# Patient Record
Sex: Female | Born: 1940 | Race: White | Hispanic: No | Smoking: Current every day smoker
Health system: Southern US, Community
[De-identification: ages and names within clinical notes are randomized; demographics above are authoritative.]

## PROBLEM LIST (undated history)

## (undated) DIAGNOSIS — F0390 Unspecified dementia without behavioral disturbance: Secondary | ICD-10-CM

## (undated) DIAGNOSIS — E538 Deficiency of other specified B group vitamins: Secondary | ICD-10-CM

## (undated) DIAGNOSIS — G4700 Insomnia, unspecified: Secondary | ICD-10-CM

## (undated) DIAGNOSIS — I4819 Other persistent atrial fibrillation: Secondary | ICD-10-CM

## (undated) DIAGNOSIS — R55 Syncope and collapse: Secondary | ICD-10-CM

## (undated) DIAGNOSIS — F39 Unspecified mood [affective] disorder: Secondary | ICD-10-CM

## (undated) DIAGNOSIS — I951 Orthostatic hypotension: Secondary | ICD-10-CM

## (undated) DIAGNOSIS — E119 Type 2 diabetes mellitus without complications: Secondary | ICD-10-CM

## (undated) DIAGNOSIS — K219 Gastro-esophageal reflux disease without esophagitis: Secondary | ICD-10-CM

## (undated) DIAGNOSIS — E785 Hyperlipidemia, unspecified: Secondary | ICD-10-CM

## (undated) DIAGNOSIS — I10 Essential (primary) hypertension: Secondary | ICD-10-CM

## (undated) HISTORY — DX: Hyperlipidemia, unspecified: E78.5

## (undated) HISTORY — DX: Deficiency of other specified B group vitamins: E53.8

## (undated) HISTORY — DX: Insomnia, unspecified: G47.00

## (undated) HISTORY — DX: Unspecified mood (affective) disorder: F39

## (undated) HISTORY — DX: Gastro-esophageal reflux disease without esophagitis: K21.9

---

## 2017-09-29 ENCOUNTER — Emergency Department
Admission: EM | Admit: 2017-09-29 | Discharge: 2017-09-29 | Disposition: A | Discharge status: Home / Self Care | Payer: Medicare Other | Attending: Emergency Medicine | Admitting: Emergency Medicine

## 2017-09-29 ENCOUNTER — Encounter: Payer: Self-pay | Admitting: Emergency Medicine

## 2017-09-29 ENCOUNTER — Other Ambulatory Visit: Payer: Self-pay

## 2017-09-29 DIAGNOSIS — F17200 Nicotine dependence, unspecified, uncomplicated: Secondary | ICD-10-CM | POA: Diagnosis not present

## 2017-09-29 DIAGNOSIS — R42 Dizziness and giddiness: Secondary | ICD-10-CM | POA: Diagnosis not present

## 2017-09-29 DIAGNOSIS — I10 Essential (primary) hypertension: Secondary | ICD-10-CM | POA: Insufficient documentation

## 2017-09-29 DIAGNOSIS — E119 Type 2 diabetes mellitus without complications: Secondary | ICD-10-CM | POA: Insufficient documentation

## 2017-09-29 HISTORY — DX: Essential (primary) hypertension: I10

## 2017-09-29 HISTORY — DX: Type 2 diabetes mellitus without complications: E11.9

## 2017-09-29 LAB — CBC WITH DIFFERENTIAL/PLATELET
Basophils Absolute: 0 10*3/uL (ref 0–0.1)
Basophils Relative: 0 %
EOS ABS: 0.1 10*3/uL (ref 0–0.7)
EOS PCT: 1 %
HCT: 38.3 % (ref 35.0–47.0)
Hemoglobin: 13.3 g/dL (ref 12.0–16.0)
LYMPHS ABS: 1.6 10*3/uL (ref 1.0–3.6)
LYMPHS PCT: 27 %
MCH: 32.3 pg (ref 26.0–34.0)
MCHC: 34.8 g/dL (ref 32.0–36.0)
MCV: 92.9 fL (ref 80.0–100.0)
MONO ABS: 0.4 10*3/uL (ref 0.2–0.9)
MONOS PCT: 6 %
Neutro Abs: 4.1 10*3/uL (ref 1.4–6.5)
Neutrophils Relative %: 66 %
PLATELETS: 111 10*3/uL — AB (ref 150–440)
RBC: 4.12 MIL/uL (ref 3.80–5.20)
RDW: 14 % (ref 11.5–14.5)
WBC: 6.2 10*3/uL (ref 3.6–11.0)

## 2017-09-29 LAB — BASIC METABOLIC PANEL
Anion gap: 10 (ref 5–15)
BUN: 25 mg/dL — AB (ref 6–20)
CO2: 26 mmol/L (ref 22–32)
CREATININE: 1 mg/dL (ref 0.44–1.00)
Calcium: 9.1 mg/dL (ref 8.9–10.3)
Chloride: 105 mmol/L (ref 101–111)
GFR calc Af Amer: >60 mL/min (ref >60)
GFR, EST NON AFRICAN AMERICAN: 53 mL/min — AB (ref >60)
GLUCOSE: 187 mg/dL — AB (ref 65–99)
POTASSIUM: 4.3 mmol/L (ref 3.5–5.1)
SODIUM: 141 mmol/L (ref 135–145)

## 2017-09-29 LAB — URINALYSIS, COMPLETE (UACMP) WITH MICROSCOPIC
BACTERIA UA: NONE SEEN
BILIRUBIN URINE: NEGATIVE
Glucose, UA: 50 mg/dL — AB
Hgb urine dipstick: NEGATIVE
KETONES UR: NEGATIVE mg/dL
LEUKOCYTES UA: NEGATIVE
Nitrite: NEGATIVE
Protein, ur: NEGATIVE mg/dL
SPECIFIC GRAVITY, URINE: 1.01 (ref 1.005–1.030)
pH: 6 (ref 5.0–8.0)

## 2017-09-29 LAB — TROPONIN I: Troponin I: <0.03 ng/mL (ref <0.03)

## 2017-09-29 MED ORDER — DILTIAZEM HCL 25 MG/5ML IV SOLN: 5.0000 mg | Freq: Once | INTRAVENOUS | Status: DC

## 2017-09-29 NOTE — ED Notes (Signed)
Pt ambulated around room w/ this nurse at standby, normal gait noted.  Pt reports she does have walker at home if needed.  Pt denies any dizziness.

## 2017-09-29 NOTE — ED Triage Notes (Signed)
Patient brought in by ems from home. Patient states that she woke up from a nap feeling dizzy. Patient reported to ems that she has cough and congestion. Per ems vital signs stable and fsbs 212.

## 2017-09-29 NOTE — ED Provider Notes (Signed)
[facility] Emergency Department Provider Note   ____________________________________________   I have reviewed the triage vital signs and the nursing notes.   HISTORY  Chief Complaint Dizziness   History limited by: Not Limited   HPI Meghan Lloyd is a 76 y.o. female who presents to the emergency department today because of dizziness.  DURATION:a few hours TIMING: constant QUALITY: dizziness CONTEXT: patient started feeling these symptoms after waking up from a nap. Patient has history of afib. Recent admission for afib and diabetic coma per family.  MODIFYING FACTORS: none ASSOCIATED SYMPTOMS: denies any chest pain. Has felt palpitations.  Per medical record review patient with no previous visit in our system.  Past Medical History:  Diagnosis Date  . Atrial fibrillation (HCC)   . Diabetes mellitus without complication (HCC)   . Hypertension     There are no active problems to display for this patient.   Prior to Admission medications   Not on File    Allergies Patient has no known allergies.  History reviewed. No pertinent family history.  Social History Social History   Tobacco Use  . Smoking status: Current Every Day Smoker  . Smokeless tobacco: Never Used  Substance Use Topics  . Alcohol use: Yes  . Drug use: Not on file    Review of Systems Constitutional: No fever/chills Eyes: No visual changes. ENT: No sore throat. Cardiovascular: Denies chest pain. Positive for palpitations. Respiratory: Denies shortness of breath. Gastrointestinal: No abdominal pain.  No nausea, no vomiting.  No diarrhea.   Genitourinary: Negative for dysuria. Musculoskeletal: Negative for back pain. Skin: Negative for rash. Neurological: Positive for dizziness.  ____________________________________________   PHYSICAL EXAM:  VITAL SIGNS: ED Triage Vitals  Enc Vitals Group     BP 09/29/17 1957 117/66     Pulse Rate 09/29/17 1957 (!) 126      Resp 09/29/17 1957 (!) 22     Temp 09/29/17 1957 97.8 F (36.6 C)     Temp Source 09/29/17 1957 Oral     SpO2 09/29/17 1957 97 %     Weight 09/29/17 1953 200 lb (90.7 kg)     Height 09/29/17 1953 5\' 7"  (1.702 m)   Constitutional: Alert and oriented. Well appearing and in no distress. Eyes: Conjunctivae are normal.  ENT   Head: Normocephalic and atraumatic.   Nose: No congestion/rhinnorhea.   Mouth/Throat: Mucous membranes are moist.   Neck: No stridor. Hematological/Lymphatic/Immunilogical: No cervical lymphadenopathy. Cardiovascular: Tachycardic, irregularly irregular rhythm.  No murmurs, rubs, or gallops. Respiratory: Normal respiratory effort without tachypnea nor retractions. Breath sounds are clear and equal bilaterally. No wheezes/rales/rhonchi. Gastrointestinal: Soft and non tender. No rebound. No guarding.  Genitourinary: Deferred Musculoskeletal: Normal range of motion in all extremities. No lower extremity edema. Neurologic:  Normal speech and language. No gross focal neurologic deficits are appreciated.  Skin:  Skin is warm, dry and intact. No rash noted. Psychiatric: Mood and affect are normal. Speech and behavior are normal. Patient exhibits appropriate insight and judgment.  ____________________________________________    LABS (pertinent positives/negatives)  Trop <0.03 UA not consistent with infection CBC wnl except platelet 111 BMP glu 187, cr 1.00, na 141 ____________________________________________   EKG  I, Phineas SemenGraydon Dylan Monforte, attending physician, personally viewed and interpreted this EKG  EKG Time: 1953 Rate: 146 Rhythm: atrial fibrillation with RVR Axis: normal Intervals: qtc 492 QRS: RBBB ST changes: no st elevation Impression: abnormal ekg   ____________________________________________     RADIOLOGY  None  ____________________________________________  PROCEDURES  Procedures  ____________________________________________   INITIAL IMPRESSION / ASSESSMENT AND PLAN / ED COURSE  Pertinent labs & imaging results that were available during my care of the patient were reviewed by me and considered in my medical decision making (see chart for details).  Patient presented to the emergency department today because of concerns for dizziness.  Differential would include anemia, electrolyte abnormality, dysrhythmia, cardiac disease amongst other etiologies.  Initial EKG did show A. fib with RVR.  Patient does have a history of A. fib.  She is on diltiazem.  Her heart rate however did improve here in the emergency department without any intervention.  Patient says that the dizziness had resolved.  She was able to get up with nursing staff without any difficulty.  No concerning signs for infections.  Discussed with the daughter-in-law who thinks that potentially part of the issue is that the patient is having a hard time adjusting to living with her daughter-in-law and son.  Apparently she did get upset this evening.   ____________________________________________   FINAL CLINICAL IMPRESSION(S) / ED DIAGNOSES  Final diagnoses:  Dizziness     Note: This dictation was prepared with Dragon dictation. Any transcriptional errors that result from this process are unintentional     Phineas SemenGoodman, Rutger Salton, MD 09/29/17 2320

## 2017-09-29 NOTE — ED Triage Notes (Signed)
Reports woke up feeling dizzy.  Also reports having cough and congesting for a few days.

## 2017-09-29 NOTE — Discharge Instructions (Signed)
Please seek medical attention for any high fevers, chest pain, shortness of breath, change in behavior, persistent vomiting, bloody stool or any other new or concerning symptoms.  

## 2017-09-30 ENCOUNTER — Observation Stay: Payer: Medicare Other

## 2017-09-30 ENCOUNTER — Inpatient Hospital Stay
Admission: EM | Admit: 2017-09-30 | Discharge: 2017-10-05 | DRG: 312 | Disposition: A | Discharge status: Home Health | Payer: Medicare Other | Attending: Internal Medicine | Admitting: Internal Medicine

## 2017-09-30 ENCOUNTER — Emergency Department: Payer: Medicare Other

## 2017-09-30 DIAGNOSIS — F0391 Unspecified dementia with behavioral disturbance: Secondary | ICD-10-CM | POA: Diagnosis present

## 2017-09-30 DIAGNOSIS — E119 Type 2 diabetes mellitus without complications: Secondary | ICD-10-CM | POA: Diagnosis present

## 2017-09-30 DIAGNOSIS — I481 Persistent atrial fibrillation: Secondary | ICD-10-CM | POA: Diagnosis present

## 2017-09-30 DIAGNOSIS — I951 Orthostatic hypotension: Principal | ICD-10-CM | POA: Diagnosis present

## 2017-09-30 DIAGNOSIS — M549 Dorsalgia, unspecified: Secondary | ICD-10-CM

## 2017-09-30 DIAGNOSIS — Z66 Do not resuscitate: Secondary | ICD-10-CM | POA: Diagnosis present

## 2017-09-30 DIAGNOSIS — R55 Syncope and collapse: Secondary | ICD-10-CM | POA: Diagnosis present

## 2017-09-30 DIAGNOSIS — W19XXXA Unspecified fall, initial encounter: Secondary | ICD-10-CM | POA: Diagnosis present

## 2017-09-30 DIAGNOSIS — R296 Repeated falls: Secondary | ICD-10-CM | POA: Diagnosis present

## 2017-09-30 DIAGNOSIS — F03918 Unspecified dementia, unspecified severity, with other behavioral disturbance: Secondary | ICD-10-CM

## 2017-09-30 DIAGNOSIS — Z79899 Other long term (current) drug therapy: Secondary | ICD-10-CM

## 2017-09-30 DIAGNOSIS — F17200 Nicotine dependence, unspecified, uncomplicated: Secondary | ICD-10-CM | POA: Diagnosis present

## 2017-09-30 DIAGNOSIS — I10 Essential (primary) hypertension: Secondary | ICD-10-CM | POA: Diagnosis present

## 2017-09-30 DIAGNOSIS — I252 Old myocardial infarction: Secondary | ICD-10-CM

## 2017-09-30 DIAGNOSIS — F329 Major depressive disorder, single episode, unspecified: Secondary | ICD-10-CM | POA: Diagnosis present

## 2017-09-30 DIAGNOSIS — M546 Pain in thoracic spine: Secondary | ICD-10-CM | POA: Diagnosis present

## 2017-09-30 DIAGNOSIS — I4892 Unspecified atrial flutter: Secondary | ICD-10-CM | POA: Diagnosis present

## 2017-09-30 DIAGNOSIS — R9431 Abnormal electrocardiogram [ECG] [EKG]: Secondary | ICD-10-CM

## 2017-09-30 DIAGNOSIS — Z7902 Long term (current) use of antithrombotics/antiplatelets: Secondary | ICD-10-CM

## 2017-09-30 DIAGNOSIS — Z9181 History of falling: Secondary | ICD-10-CM

## 2017-09-30 DIAGNOSIS — I482 Chronic atrial fibrillation: Secondary | ICD-10-CM | POA: Diagnosis present

## 2017-09-30 DIAGNOSIS — R45851 Suicidal ideations: Secondary | ICD-10-CM | POA: Diagnosis present

## 2017-09-30 LAB — COMPREHENSIVE METABOLIC PANEL
ALBUMIN: 3.7 g/dL (ref 3.5–5.0)
ALK PHOS: 62 U/L (ref 38–126)
ALT: 13 U/L — AB (ref 14–54)
AST: 19 U/L (ref 15–41)
Anion gap: 9 (ref 5–15)
BILIRUBIN TOTAL: 0.8 mg/dL (ref 0.3–1.2)
BUN: 20 mg/dL (ref 6–20)
CALCIUM: 8.7 mg/dL — AB (ref 8.9–10.3)
CO2: 26 mmol/L (ref 22–32)
CREATININE: 1.08 mg/dL — AB (ref 0.44–1.00)
Chloride: 105 mmol/L (ref 101–111)
GFR calc Af Amer: 56 mL/min — ABNORMAL LOW (ref >60)
GFR calc non Af Amer: 48 mL/min — ABNORMAL LOW (ref >60)
GLUCOSE: 200 mg/dL — AB (ref 65–99)
Potassium: 4.3 mmol/L (ref 3.5–5.1)
SODIUM: 140 mmol/L (ref 135–145)
TOTAL PROTEIN: 6.2 g/dL — AB (ref 6.5–8.1)

## 2017-09-30 LAB — TROPONIN I: Troponin I: <0.03 ng/mL (ref <0.03)

## 2017-09-30 LAB — CBC
HEMATOCRIT: 39.1 % (ref 35.0–47.0)
HEMOGLOBIN: 13.4 g/dL (ref 12.0–16.0)
MCH: 32 pg (ref 26.0–34.0)
MCHC: 34.4 g/dL (ref 32.0–36.0)
MCV: 93 fL (ref 80.0–100.0)
Platelets: 112 10*3/uL — ABNORMAL LOW (ref 150–440)
RBC: 4.2 MIL/uL (ref 3.80–5.20)
RDW: 14 % (ref 11.5–14.5)
WBC: 5.1 10*3/uL (ref 3.6–11.0)

## 2017-09-30 LAB — CK: Total CK: 55 U/L (ref 38–234)

## 2017-09-30 MED ORDER — OLANZAPINE 2.5 MG PO TABS
2.5000 mg | ORAL_TABLET | Freq: Every day | ORAL | Status: DC
  Administered 2017-09-30 – 2017-10-01 (×2): 2.5 mg via ORAL
  Filled 2017-09-30 (×3): qty 1

## 2017-09-30 MED ORDER — SODIUM CHLORIDE 0.9 % IV SOLN
INTRAVENOUS | Status: DC
  Administered 2017-09-30 – 2017-10-01 (×2): via INTRAVENOUS

## 2017-09-30 MED ORDER — ONDANSETRON HCL 4 MG/2ML IJ SOLN: 4.0000 mg | Freq: Four times a day (QID) | INTRAMUSCULAR | Status: DC | PRN

## 2017-09-30 MED ORDER — ONDANSETRON HCL 4 MG PO TABS: 4.0000 mg | ORAL_TABLET | Freq: Four times a day (QID) | ORAL | Status: DC | PRN

## 2017-09-30 MED ORDER — DILTIAZEM HCL 30 MG PO TABS
30.0000 mg | ORAL_TABLET | Freq: Three times a day (TID) | ORAL | Status: DC
  Administered 2017-10-01: 30 mg via ORAL
  Filled 2017-09-30 (×5): qty 1

## 2017-09-30 MED ORDER — CLOPIDOGREL BISULFATE 75 MG PO TABS
75.0000 mg | ORAL_TABLET | Freq: Every day | ORAL | Status: DC
  Administered 2017-09-30 – 2017-10-05 (×6): 75 mg via ORAL
  Filled 2017-09-30 (×6): qty 1

## 2017-09-30 MED ORDER — POTASSIUM CHLORIDE CRYS ER 20 MEQ PO TBCR
20.0000 meq | EXTENDED_RELEASE_TABLET | Freq: Every day | ORAL | Status: DC
  Administered 2017-09-30 – 2017-10-05 (×6): 20 meq via ORAL
  Filled 2017-09-30 (×6): qty 1

## 2017-09-30 MED ORDER — SODIUM CHLORIDE 0.9% FLUSH
3.0000 mL | Freq: Two times a day (BID) | INTRAVENOUS | Status: DC
  Administered 2017-09-30 – 2017-10-05 (×9): 3 mL via INTRAVENOUS

## 2017-09-30 MED ORDER — ENOXAPARIN SODIUM 40 MG/0.4ML ~~LOC~~ SOLN
40.0000 mg | SUBCUTANEOUS | Status: DC
  Administered 2017-09-30 – 2017-10-05 (×5): 40 mg via SUBCUTANEOUS
  Filled 2017-09-30 (×5): qty 0.4

## 2017-09-30 MED ORDER — PRAVASTATIN SODIUM 20 MG PO TABS
80.0000 mg | ORAL_TABLET | Freq: Every day | ORAL | Status: DC
  Administered 2017-09-30 – 2017-10-05 (×6): 80 mg via ORAL
  Filled 2017-09-30 (×6): qty 4

## 2017-09-30 MED ORDER — SODIUM CHLORIDE 0.9 % IV BOLUS (SEPSIS)
500.0000 mL | Freq: Once | INTRAVENOUS | Status: AC
  Administered 2017-09-30: 500 mL via INTRAVENOUS

## 2017-09-30 NOTE — ED Provider Notes (Signed)
[facility] Emergency Department Provider Note   ____________________________________________   None    (approximate)  I have reviewed the triage vital signs and the nursing notes.   HISTORY  Chief Complaint Loss of Consciousness    HPI Lucillie Kiesel is a 76 y.o. female here for evaluation after he passed out  Patient reports that she got up to make a, coffee, while standing there she suddenly felt lightheaded, woke up on the floor.  She denies any chest pain headache or injury.  She reports that she has no numbness or weakness in her speech is the same.  She has been feeling dizzy for several days, she was seen here yesterday and sent home.  No fevers or chills.  No nausea or vomiting.  No chest pain or trouble breathing.  No numbness or weakness in any arm or leg.  No trouble speaking.  Denies any ongoing pain but reports that she has been feeling generally fatigued and like a "punched pumpkin"  No abdominal pain no trouble urinating  Past Medical History:  Diagnosis Date  . Atrial fibrillation (HCC)   . Diabetes mellitus without complication (HCC)   . Hypertension     There are no active problems to display for this patient.   History reviewed. No pertinent surgical history.  Prior to Admission medications   Not on File    Allergies Patient has no known allergies.  No family history on file.  Social History Social History   Tobacco Use  . Smoking status: Current Every Day Smoker  . Smokeless tobacco: Never Used  Substance Use Topics  . Alcohol use: Yes  . Drug use: Not on file    Review of Systems Constitutional: No fever/chills reports she just feels generally fatigued and weak all over Eyes: No visual changes. ENT: No sore throat. Cardiovascular: Denies chest pain. Respiratory: Denies shortness of breath. Gastrointestinal: No abdominal pain.  No nausea, no vomiting.  No diarrhea.  No constipation. Genitourinary: Negative  for dysuria. Musculoskeletal: Negative for back pain. Skin: Negative for rash. Neurological: Negative for headaches, focal weakness or numbness.    ____________________________________________   PHYSICAL EXAM:  VITAL SIGNS: ED Triage Vitals  Enc Vitals Group     BP 09/30/17 1051 138/66     Pulse Rate 09/30/17 1051 92     Resp 09/30/17 1051 16     Temp 09/30/17 1051 97.6 F (36.4 C)     Temp Source 09/30/17 1051 Oral     SpO2 09/30/17 1051 98 %     Weight 09/30/17 1042 200 lb (90.7 kg)     Height 09/30/17 1042 5\' 7"  (1.702 m)     Head Circumference --      Peak Flow --      Pain Score --      Pain Loc --      Pain Edu? --      Excl. in GC? --     Constitutional: Alert and oriented to self, city, but not to year. Well appearing and in no acute distress. Eyes: Conjunctivae are normal. Head: Atraumatic. Nose: No congestion/rhinnorhea. Mouth/Throat: Mucous membranes are lightly dry. Neck: No stridor.   Cardiovascular: Normal rate, irregular rhythm. Grossly normal heart sounds.  Good peripheral circulation. Respiratory: Normal respiratory effort.  No retractions. Lungs CTAB. Gastrointestinal: Soft and nontender. No distention. Musculoskeletal: No lower extremity tenderness nor edema. Neurologic:  Normal speech and language. No gross focal neurologic deficits are appreciated.  Moves all extremities with  normal strength.  Equal smile.  Clear speech. Skin:  Skin is warm, dry and intact. No rash noted. Psychiatric: Mood and affect are normal. Speech and behavior are normal.  ____________________________________________   LABS (all labs ordered are listed, but only abnormal results are displayed)  Labs Reviewed  CBC - Abnormal; Notable for the following components:      Result Value   Platelets 112 (*)    All other components within normal limits  COMPREHENSIVE METABOLIC PANEL - Abnormal; Notable for the following components:   Glucose, Bld 200 (*)    Creatinine, Ser  1.08 (*)    Calcium 8.7 (*)    Total Protein 6.2 (*)    ALT 13 (*)    GFR calc non Af Amer 48 (*)    GFR calc Af Amer 56 (*)    All other components within normal limits  TROPONIN I  CK   ____________________________________________  EKG  Reviewed and arriving at 10:50 AM Heart rate 90 Cures 130 QTC 440 Atrial fibrillation with right bundle branch block and T wave depressions and some inversion seen in inferolateral distribution without ST elevation.  T wave depressions are in a similar distribution to the EKG from yesterday, no acute changes noted except rate is now improved ____________________________________________  RADIOLOGY  Ct Head Wo Contrast  Result Date: 09/30/2017 CLINICAL DATA:  Syncope with dizziness. EXAM: CT HEAD WITHOUT CONTRAST TECHNIQUE: Contiguous axial images were obtained from the base of the skull through the vertex without intravenous contrast. COMPARISON:  None. FINDINGS: Brain: There is no evidence for acute hemorrhage, hydrocephalus, mass lesion, or abnormal extra-axial fluid collection. No definite CT evidence for acute infarction. Diffuse loss of parenchymal volume is consistent with atrophy. Vascular: No hyperdense vessel or unexpected calcification. Skull: No evidence for fracture. No worrisome lytic or sclerotic lesion. Sinuses/Orbits: The visualized paranasal sinuses and mastoid air cells are clear. Visualized portions of the globes and intraorbital fat are unremarkable. Other: None. IMPRESSION: 1. No acute intracranial abnormality. Electronically Signed   By: Kennith CenterEric  Mansell M.D.   On: 09/30/2017 11:11   Dg Chest Portable 1 View  Result Date: 09/30/2017 CLINICAL DATA:  Syncope EXAM: PORTABLE CHEST 1 VIEW COMPARISON:  None. FINDINGS: Heart is borderline in size. Lungs are clear. No effusions or acute bony abnormality. IMPRESSION: Mild cardiomegaly.  No active disease. Electronically Signed   By: Charlett NoseKevin  Dover M.D.   On: 09/30/2017 11:28     Chest x-ray  reviewed, mild cardiomegaly.  CT had no acute ____________________________________________   PROCEDURES  Procedure(s) performed: None  Procedures  Critical Care performed: No  ____________________________________________   INITIAL IMPRESSION / ASSESSMENT AND PLAN / ED COURSE  Pertinent labs & imaging results that were available during my care of the patient were reviewed by me and considered in my medical decision making (see chart for details).   Patient seen yesterday for dizziness, evaluated and discharged home.  Now presenting with a syncopal episode today.  She was notably orthostatic with EMS reporting her heart rate jumping to 140 with attempted orthostatics.  She is presently without complaint other than feeling generalized fatigue.  No neurologic symptoms.  Etiology of her syncope is unclear, but I suspect likely some type of prerenal etiology given her orthostasis, however will initiate workup.  Reviewed work from yesterday, lab work is very reassuring.  She had a urinalysis that was done and normal.  Today I will add on a CT of the head, chest x-ray, we will give her  500 mL of normal saline which EMS started, and continue to monitor her closely at this point for etiology of syncope.  It thus far no clear etiology based on clinical examination, though I suspect possibly dehydration or prerenal.  ----------------------------------------- 10:56 AM on 09/30/2017 ----------------------------------------- Patient resting comfortably at this time.  Daughter at the bedside.  ----------------------------------------- 12:02 PM on 09/30/2017 -----------------------------------------  Patient is awake and alert now, in no distress.  Thus far lab work and results are very reassuring, however she does have T wave inversions that are quite notable on her EKG and her only previous for comparison is yesterday.  This could represent potential for cardiac etiology, and I discussed plan of  care and offered plan for discharge and close follow-up after hydration with the family who are notably concerned, and would like for her to be admitted for further observation.  We discussed this possibility as well, and after discussion with patient and her daughter they do not feel comfortable the plan to go home and are agreeable to plan for observation      ____________________________________________   FINAL CLINICAL IMPRESSION(S) / ED DIAGNOSES  Final diagnoses:  Abnormal EKG  Syncope and collapse      NEW MEDICATIONS STARTED DURING THIS VISIT:  This SmartLink is deprecated. Use AVSMEDLIST instead to display the medication list for a patient.   Note:  This document was prepared using Dragon voice recognition software and may include unintentional dictation errors.     Sharyn CreamerQuale, Mariabella Nilsen, MD 09/30/17 [phone]

## 2017-09-30 NOTE — Progress Notes (Signed)
LCSW consulted with EDP and Care manager and this is currently in place with daughter for patient DC plan.  Lengthy discussion occurred explaining to patient and daughter there are no medicare days left. Daughter reports she understood and her Mom will return home with her.   Daughter reported that [facility]  Team will come out on Monday to begin providing patient in home OT and PT and nurses aid services.   [facility] [place] [facility] [place]   Patients daughter has arranged neurology [facility] on [place] 5[facility].   Patient is fairly independent with ADL's and can walk and uses a cane. Daughter was provided Pensions consultanteldercare resource handout and Dementia Services. Once patient has medical work up  she is to return home with daughter.    no further needs.   Calyb Mcquarrie LCSW

## 2017-09-30 NOTE — H&P (Signed)
[facility] [place] [facility]   PATIENT NAME: Meghan KettleDorothy Lloyd    MR#:  [id number]  DATE OF BIRTH:  17-Aug-1941  DATE OF ADMISSION:  09/30/2017  [facility]IC[facility]   REQUESTING/REFERRING PHYSICIAN: QUALE  CHIEF COMPLAINT:   Syncope HISTORY OF PRESENT ILLNESS:  Meghan Lloyd  is a 76 y.o. female with a known history of chronic atrial flutter ablation, diabetes mellitus, hypertension and 2 month history of syncope [facility] and car discharged to rehabilitation center. Recently released from the rehabilitation center and living with the daughter-in-law had a syncopal episode today. Patient was feeling dizzy and then about to fall to the ground but son caught her on time and patient did not sustain any injuries. CT head in the ED is negative. According to the EMS patient was significantly hypotensive when she stood up. Patient was also becoming tachycardic whenever she stands up and heart rate went up to 130s to 140 according to the EMS. Patient denies any nausea vomiting or diarrhea. Denies any chest pain. Daughter-in-law at bedside.  PAST MEDICAL HISTORY:   Past Medical History:  Diagnosis Date  . Atrial fibrillation (HCC)   . Diabetes mellitus without complication (HCC)   . Hypertension     PAST SURGICAL HISTOIRY:  History reviewed. No pertinent surgical history.  SOCIAL HISTORY:   Social History   Tobacco Use  . Smoking status: Current Every Day Smoker  . Smokeless tobacco: Never Used  Substance Use Topics  . Alcohol use: Yes    FAMILY HISTORY:  No family history on file.  DRUG ALLERGIES:  No Known Allergies  REVIEW OF SYSTEMS:  CONSTITUTIONAL: No fever, fatigue or weakness.  EYES: No blurred or double vision.  EARS, NOSE, AND THROAT: No tinnitus or ear pain.  RESPIRATORY: No cough, shortness of breath, wheezing or hemoptysis.  CARDIOVASCULAR: No chest pain, orthopnea,  edema. Reporting dizziness when patient stands up GASTROINTESTINAL: No nausea, vomiting, diarrhea or abdominal pain.  GENITOURINARY: No dysuria, hematuria.  ENDOCRINE: No polyuria, nocturia,  HEMATOLOGY: No anemia, easy bruising or bleeding SKIN: No rash or lesion. MUSCULOSKELETAL: No joint pain or arthritis.   NEUROLOGIC: No tingling, numbness, weakness.  PSYCHIATRY: No anxiety or depression.   MEDICATIONS AT HOME:   Prior to Admission medications   Medication Sig Start Date End Date Taking? Authorizing Provider  clopidogrel (PLAVIX) 75 MG tablet Take 75 mg by mouth daily.   Yes [provider]  diltiazem (DILACOR XR) 180 MG 24 hr capsule Take 180 mg by mouth daily.   Yes [provider]  OLANZapine (ZYPREXA) 2.5 MG tablet Take 2.5 mg by mouth at bedtime.   Yes [provider]  potassium chloride SA (K-DUR,KLOR-CON) 20 MEQ tablet Take 20 mEq by mouth daily.   Yes [provider]  pravastatin (PRAVACHOL) 80 MG tablet Take 80 mg by mouth daily.   Yes [provider]      VITAL SIGNS:  Blood pressure 138/66, pulse 92, temperature 97.6 F (36.4 C), temperature source Oral, resp. rate 16, height 5\' 7"  (1.702 m), weight 90.7 kg (200 lb), SpO2 98 %.  PHYSICAL EXAMINATION:  GENERAL:  76 y.o.-year-old patient lying in the bed with no acute distress.  EYES: Pupils equal, round, reactive to light and accommodation. No scleral icterus. Extraocular muscles intact.  HEENT: Head atraumatic, normocephalic. Oropharynx and nasopharynx clear.  NECK:  Supple, no jugular venous distention. No thyroid enlargement, no tenderness.  LUNGS: Normal breath sounds bilaterally, no wheezing, rales,rhonchi or crepitation. No use of accessory muscles of respiration.  CARDIOVASCULAR: S1, S2 normal. No murmurs, rubs, or gallops.  ABDOMEN: Soft, nontender, nondistended. Bowel sounds present. No organomegaly or mass.  EXTREMITIES: No pedal edema, cyanosis, or clubbing.   NEUROLOGIC: Cranial nerves II through XII are intact. Muscle strength 5/5 in all extremities. Sensation intact. Gait not checked.  PSYCHIATRIC: The patient is alert and oriented x 3.  SKIN: No obvious rash, lesion, or ulcer.   LABORATORY PANEL:   CBC Recent Labs  Lab 09/30/17 1050  WBC 5.1  HGB 13.4  HCT 39.1  PLT 112*   ------------------------------------------------------------------------------------------------------------------  Chemistries  Recent Labs  Lab 09/30/17 1050  NA 140  K 4.3  CL 105  CO2 26  GLUCOSE 200*  BUN 20  CREATININE 1.08*  CALCIUM 8.7*  AST 19  ALT 13*  ALKPHOS 62  BILITOT 0.8   ------------------------------------------------------------------------------------------------------------------  Cardiac Enzymes Recent Labs  Lab 09/30/17 1050  TROPONINI <0.03   ------------------------------------------------------------------------------------------------------------------  RADIOLOGY:  Ct Head Wo Contrast  Result Date: 09/30/2017 CLINICAL DATA:  Syncope with dizziness. EXAM: CT HEAD WITHOUT CONTRAST TECHNIQUE: Contiguous axial images were obtained from the base of the skull through the vertex without intravenous contrast. COMPARISON:  None. FINDINGS: Brain: There is no evidence for acute hemorrhage, hydrocephalus, mass lesion, or abnormal extra-axial fluid collection. No definite CT evidence for acute infarction. Diffuse loss of parenchymal volume is consistent with atrophy. Vascular: No hyperdense vessel or unexpected calcification. Skull: No evidence for fracture. No worrisome lytic or sclerotic lesion. Sinuses/Orbits: The visualized paranasal sinuses and mastoid air cells are clear. Visualized portions of the globes and intraorbital fat are unremarkable. Other: None. IMPRESSION: 1. No acute intracranial abnormality. Electronically Signed   By: Kennith CenterEric  Mansell M.D.   On: 09/30/2017 11:11   Dg Chest Portable 1 View  Result Date:  09/30/2017 CLINICAL DATA:  Syncope EXAM: PORTABLE CHEST 1 VIEW COMPARISON:  None. FINDINGS: Heart is borderline in size. Lungs are clear. No effusions or acute bony abnormality. IMPRESSION: Mild cardiomegaly.  No active disease. Electronically Signed   By: Charlett NoseKevin  Dover M.D.   On: 09/30/2017 11:28    EKG:   Orders placed or performed during the hospital encounter of 09/30/17  . ED EKG  . ED EKG  . EKG 12-Lead  . EKG 12-Lead    IMPRESSION AND PLAN:  Meghan Lloyd  is a 76 y.o. female with a known history of chronic atrial flutter ablation, diabetes mellitus, hypertension and 2 month history of syncope [facility] and car discharged to rehabilitation center. Recently released from the rehabilitation center and living with the daughter-in-law had a syncopal episode today. Patient was feeling dizzy and then about to fall to the ground but son caught her on time and patient did not sustain any injuries. CT head in the ED is negative. According to the EMS patient was significantly hypotensive when she stood   # Syncope acute on chronic-probably from orthostatic hypotension Admitted to MedSurg unit Remote telemetry monitoring Gentle hydration with IV fluids Check orthostatics Echocardiogram and carotid Dopplers CT head, chest x-ray are negative PT evaluation Cardiology consulted to rule out cardiogenic syncope Cycle cardiac biomarkers Check x-ray of the thoracic spine as patient is complaining of upper back pain  #Chronic atrial fibrillation On Cardizem 180 mg, dose reduced to 30 mg every 8 hours   #Diabetes mellitus Sliding scale insulin Check hemoglobin A1c  #  Essential hypertension Blood pressure is stable. Cardizem dose decreased to 30 mg every 8 hours from 180 mg CR    All the records are reviewed and case discussed with ED provider. Management plans discussed with the patient, family and they are in agreement.  CODE STATUS: DNR,  daughter -in-law - HCPOA   TOTAL TIME TAKING CARE OF THIS PATIENT: 43 minutes.   Note: This dictation was prepared with Dragon dictation along with smaller phrase technology. Any transcriptional errors that result from this process are unintentional.  Ramonita Lab M.D on 09/30/2017 at 1:47 PM  Between 7am to 6pm - Pager - [phone]  After 6pm go to [email or web address] - password [email or web address] [facility]  [facility] [place]  Office  [phone]  CC: Primary care physician; Raynelle Bring

## 2017-09-30 NOTE — ED Triage Notes (Signed)
PT came to ED via EMS from home, c/o on and off dizziness. Had syncopal episode today, also seen here yesterday for same complaint.

## 2017-09-30 NOTE — Clinical Social Work Note (Signed)
Clinical Social Work Assessment  Patient Details  Name: Meghan Lloyd MRN: [id number] Date of Birth: 13-Jan-1941  Date of referral:  09/30/17               Reason for consult:  Discharge Planning                Permission sought to share information with:  Family Supports, Magazine features editoracility Contact Representative Permission granted to share information::  Yes, Verbal Permission Granted  Name::     Daughter Jetty PeeksLisa Lindroth: [phone]  Agency::     Relationship::     Contact Information:     Housing/Transportation Living arrangements for the past 2 months:  Single Family Home Source of Information:  Patient, Adult Children Patient Interpreter Needed:  None Criminal Activity/Legal Involvement Pertinent to Current Situation/Hospitalization:  No - Comment as needed Significant Relationships:  Adult Children Lives with:  Adult Children Do you feel safe going back to the place where you live?    Need for family participation in patient care:  Yes (Comment)  Care giving concerns: Daughter concerned with recent falls and agitation   Social Worker assessment / plan:LCSW introduced myself to the patient who is oriented x3 pt gave her daughter verbal consent wasableto speak with daughter and facilities if required. Patient presented to ED due to dizziness and falling and will be admitted to observation, Lengthy discussion occurred explaining to patient and daughter there are no medicare days left. Daughter reports she understood and her Mom will return home with her. Daughter reported that [facility]  Team will come out on Monday to begin providing patient in home OT and PT and nurses aid services. [facility] [place]. Patients daughter has [facility] [place]. Patient is fairly independent with ADL's and can walk and uses a cane. Daughter was provided Pensions consultanteldercare resource handout and Dementia Services. On  patient has medical work up once DC she is to return home with daughter no further needs.  Employment status:  Retired Health and safety inspectornsurance information:  Armed forces operational officerMedicare, Other (Comment Required)([employer] plannerCross [employer] [place]) PT Recommendations:    Information / Referral to community resources:     Patient/Family's Response to care: She needs more help  Patient/Family's Understanding of and Emotional Response to Diagnosis, Current Treatment, and Prognosis:  Patient has limited understanding of her situation and wishes to return home.  Emotional Assessment Appearance:  Appears stated age Attitude/Demeanor/Rapport:  Combative, Unable to Assess, Other(Patient has dementia and gets easily angered) Affect (typically observed):  Guarded, Agitated Orientation:  Oriented to Self, Oriented to Place, Oriented to  Time, Oriented to Situation Alcohol / Substance use:  Not Applicable Psych involvement (Current and /or in the community):  No (Comment)  Discharge Needs  Concerns to be addressed:  Discharge Planning Concerns Readmission within the last 30 days:  No Current discharge risk:  None Barriers to Discharge:  No Barriers Identified, Continued Medical Work up   [place]gett LandingBandi, Monettelaudine M, LCSW 09/30/2017, 1:58 PM

## 2017-10-01 ENCOUNTER — Observation Stay: Payer: Medicare Other

## 2017-10-01 ENCOUNTER — Observation Stay (HOSPITAL_BASED_OUTPATIENT_CLINIC_OR_DEPARTMENT_OTHER)
Admit: 2017-10-01 | Discharge: 2017-10-01 | Disposition: A | Discharge status: Home / Self Care | Payer: Medicare Other | Attending: Internal Medicine | Admitting: Internal Medicine

## 2017-10-01 DIAGNOSIS — I351 Nonrheumatic aortic (valve) insufficiency: Secondary | ICD-10-CM

## 2017-10-01 DIAGNOSIS — R55 Syncope and collapse: Secondary | ICD-10-CM

## 2017-10-01 DIAGNOSIS — I481 Persistent atrial fibrillation: Secondary | ICD-10-CM

## 2017-10-01 DIAGNOSIS — F0281 Dementia in other diseases classified elsewhere with behavioral disturbance: Secondary | ICD-10-CM | POA: Diagnosis not present

## 2017-10-01 DIAGNOSIS — G301 Alzheimer's disease with late onset: Secondary | ICD-10-CM | POA: Diagnosis not present

## 2017-10-01 DIAGNOSIS — R9431 Abnormal electrocardiogram [ECG] [EKG]: Secondary | ICD-10-CM

## 2017-10-01 DIAGNOSIS — I340 Nonrheumatic mitral (valve) insufficiency: Secondary | ICD-10-CM | POA: Diagnosis not present

## 2017-10-01 DIAGNOSIS — W19XXXS Unspecified fall, sequela: Secondary | ICD-10-CM

## 2017-10-01 DIAGNOSIS — F03918 Unspecified dementia, unspecified severity, with other behavioral disturbance: Secondary | ICD-10-CM

## 2017-10-01 DIAGNOSIS — R413 Other amnesia: Secondary | ICD-10-CM

## 2017-10-01 DIAGNOSIS — F0391 Unspecified dementia with behavioral disturbance: Secondary | ICD-10-CM

## 2017-10-01 LAB — COMPREHENSIVE METABOLIC PANEL
ALK PHOS: 49 U/L (ref 38–126)
ALT: 10 U/L — ABNORMAL LOW (ref 14–54)
ANION GAP: 5 (ref 5–15)
AST: 15 U/L (ref 15–41)
Albumin: 3 g/dL — ABNORMAL LOW (ref 3.5–5.0)
BUN: 19 mg/dL (ref 6–20)
CALCIUM: 8.3 mg/dL — AB (ref 8.9–10.3)
CO2: 25 mmol/L (ref 22–32)
Chloride: 112 mmol/L — ABNORMAL HIGH (ref 101–111)
Creatinine, Ser: 0.95 mg/dL (ref 0.44–1.00)
GFR calc non Af Amer: 56 mL/min — ABNORMAL LOW (ref >60)
Glucose, Bld: 167 mg/dL — ABNORMAL HIGH (ref 65–99)
Potassium: 4 mmol/L (ref 3.5–5.1)
SODIUM: 142 mmol/L (ref 135–145)
TOTAL PROTEIN: 5.3 g/dL — AB (ref 6.5–8.1)
Total Bilirubin: 0.3 mg/dL (ref 0.3–1.2)

## 2017-10-01 LAB — CBC
HCT: 35.5 % (ref 35.0–47.0)
HEMOGLOBIN: 12.1 g/dL (ref 12.0–16.0)
MCH: 31.5 pg (ref 26.0–34.0)
MCHC: 33.9 g/dL (ref 32.0–36.0)
MCV: 92.9 fL (ref 80.0–100.0)
Platelets: 99 10*3/uL — ABNORMAL LOW (ref 150–440)
RBC: 3.83 MIL/uL (ref 3.80–5.20)
RDW: 14.3 % (ref 11.5–14.5)
WBC: 4.8 10*3/uL (ref 3.6–11.0)

## 2017-10-01 LAB — TROPONIN I

## 2017-10-01 LAB — ECHOCARDIOGRAM COMPLETE
HEIGHTINCHES: 67 in
Weight: 2640 oz

## 2017-10-01 LAB — GLUCOSE, CAPILLARY: GLUCOSE-CAPILLARY: 161 mg/dL — AB (ref 65–99)

## 2017-10-01 LAB — TSH: TSH: 0.867 u[IU]/mL (ref 0.350–4.500)

## 2017-10-01 MED ORDER — DIGOXIN 0.25 MG/ML IJ SOLN
0.1250 mg | Freq: Every day | INTRAMUSCULAR | Status: DC
  Administered 2017-10-02 – 2017-10-03 (×2): 0.125 mg via INTRAVENOUS
  Filled 2017-10-01 (×3): qty 0.5

## 2017-10-01 MED ORDER — DILTIAZEM HCL ER COATED BEADS 120 MG PO CP24
120.0000 mg | ORAL_CAPSULE | Freq: Every day | ORAL | Status: DC
  Administered 2017-10-01 – 2017-10-05 (×5): 120 mg via ORAL
  Filled 2017-10-01 (×5): qty 1

## 2017-10-01 MED ORDER — METOPROLOL TARTRATE 25 MG PO TABS
25.0000 mg | ORAL_TABLET | Freq: Three times a day (TID) | ORAL | Status: DC
  Administered 2017-10-01: 25 mg via ORAL
  Filled 2017-10-01: qty 1

## 2017-10-01 MED ORDER — METOPROLOL TARTRATE 50 MG PO TABS
50.0000 mg | ORAL_TABLET | Freq: Two times a day (BID) | ORAL | Status: DC
  Administered 2017-10-02 – 2017-10-03 (×3): 50 mg via ORAL
  Filled 2017-10-01 (×5): qty 1

## 2017-10-01 MED ORDER — METOPROLOL TARTRATE 25 MG PO TABS
25.0000 mg | ORAL_TABLET | Freq: Once | ORAL | Status: AC
  Administered 2017-10-02: 25 mg via ORAL
  Filled 2017-10-01: qty 1

## 2017-10-01 MED ORDER — DIGOXIN 0.25 MG/ML IJ SOLN
0.5000 mg | Freq: Once | INTRAMUSCULAR | Status: AC
  Administered 2017-10-01: 0.5 mg via INTRAVENOUS
  Filled 2017-10-01 (×2): qty 2

## 2017-10-01 NOTE — Consult Note (Signed)
[facility] Face-to-Face Psychiatry Consult   Reason for Consult: Consult requested for this 76 year old woman currently in the hospital with syncope and arrhythmia.  Concern was expressed about depressive symptoms Referring Physician:  Marthann Schiller Patient Identification: Meghan Lloyd MRN:  [id number] Principal Diagnosis: Dementia with behavioral disturbance Diagnosis:   Patient Active Problem List   Diagnosis Date Noted  . Dementia with behavioral disturbance [F03.91] 10/01/2017  . Syncope [R55] 09/30/2017    Total Time spent with patient: 1 hour  Subjective:   Meghan Lloyd is a 76 y.o. female patient admitted with "I do not know how long I can stand to be here".  HPI: Patient interviewed chart reviewed.  I also spoke with the patient's daughter-in-law by telephone.  This 76 year old woman was brought into the hospital because of a syncopal episode and found to have atrial flutter and his cardiac problems that have led to a hospitalization of a few days.  Consultation was requested today apparently because of concern about depressive symptoms.  Patient was cooperative and quite talkative with me today.  She complained at great length about how her "stepdaughter" and the rest of her family have tricked her into coming up here to the [place] area and how she wants to go back home as soon as possible.  Patient did tell me that she has had a lot of suffering recently.  She told me that her husband and son both died of cancer within the last few months.  All of the story was rambling and somewhat confused and when I spoke with her daughter-in-law later it turned out that much of it was not entirely accurate of course.  Patient seems to be vaguely paranoid but is not expressing any homicidal or suicidal ideation at all.  She says her mood is down at times but definitely does not report hopelessness.  Very definitely says she has no suicidal thoughts at all and in fact says she feels like she has a lot to live  for.  She denies any sleep or appetite problems.  Patient does not report being aware of any hallucinations.  She clearly has significant memory problems.  Social history: She recently has been living with her stepson and his wife, Lattie Haw, who has been the main contact while the patient is in the hospital.  They are the patient's only remaining family who are willing to take care of her.  Her husband actually died over 73 years ago and her other sons who are deceased died several years ago each.  Even the dog that the patient insists she needs to go take care of died several years ago.  Patient has been in rehab facilities in the past and has had some trouble with behavior problems.  Medical history: Patient has diabetes high blood pressure chronic heart arrhythmia problems she reports a past history of one myocardial infarction.  Currently in the hospital with atrial flutter and syncope.  Substance abuse history: Patient tells me that she used to drink a fair bit but does not think it was ever a problem and says she has not had a drink in a couple years at least.  Past Psychiatric History: It does not sound like she is ever had psychiatric hospitalization but it does sound like she has had some behavior problems with intermittent spells of agitation and aggression related to dementia when she has been in rehab facilities in the past.  She is currently on 2-1/2 mg of olanzapine at night which was among her medications  on admission.  Presumably that was started during 1 of the previous days in rehab during which as the daughter says the patient was prone to getting violent with staff.  There is no history however of suicide attempts no history of previous major depression or mood symptoms.  Risk to Self: Is patient at risk for suicide?: No Risk to Others:   Prior Inpatient Therapy:   Prior Outpatient Therapy:    Past Medical History:  Past Medical History:  Diagnosis Date  . Atrial fibrillation ([place])    . Diabetes mellitus without complication ([place])   . Hypertension    History reviewed. No pertinent surgical history. Family History: No family history on file. Family Psychiatric  History: None known Social History:  Social History   Substance and Sexual Activity  Alcohol Use Yes     Social History   Substance and Sexual Activity  Drug Use Not on file    Social History   Socioeconomic History  . Marital status: Widowed    Spouse name: None  . Number of children: None  . Years of education: None  . Highest education level: None  Social Needs  . Financial resource strain: None  . Food insecurity - worry: None  . Food insecurity - inability: None  . Transportation needs - medical: None  . Transportation needs - non-medical: None  Occupational History  . None  Tobacco Use  . Smoking status: Current Every Day Smoker  . Smokeless tobacco: Never Used  Substance and Sexual Activity  . Alcohol use: Yes  . Drug use: None  . Sexual activity: None  Other Topics Concern  . None  Social History Narrative  . None   Additional Social History:    Allergies:  No Known Allergies  Labs:  Results for orders placed or performed during the hospital encounter of 09/30/17 (from the past 48 hour(s))  Troponin I     Status: None   Collection Time: 09/30/17 10:50 AM  Result Value Ref Range   Troponin I <0.03 <0.03 ng/mL  CBC     Status: Abnormal   Collection Time: 09/30/17 10:50 AM  Result Value Ref Range   WBC 5.1 3.6 - 11.0 K/uL   RBC 4.20 3.80 - 5.20 MIL/uL   Hemoglobin 13.4 12.0 - 16.0 g/dL   HCT 39.1 35.0 - 47.0 %   MCV 93.0 80.0 - 100.0 fL   MCH 32.0 26.0 - 34.0 pg   MCHC 34.4 32.0 - 36.0 g/dL   RDW 14.0 11.5 - 14.5 %   Platelets 112 (L) 150 - 440 K/uL  Comprehensive metabolic panel     Status: Abnormal   Collection Time: 09/30/17 10:50 AM  Result Value Ref Range   Sodium 140 135 - 145 mmol/L   Potassium 4.3 3.5 - 5.1 mmol/L   Chloride 105 101 - 111 mmol/L   CO2 26  22 - 32 mmol/L   Glucose, Bld 200 (H) 65 - 99 mg/dL   BUN 20 6 - 20 mg/dL   Creatinine, Ser 1.08 (H) 0.44 - 1.00 mg/dL   Calcium 8.7 (L) 8.9 - 10.3 mg/dL   Total Protein 6.2 (L) 6.5 - 8.1 g/dL   Albumin 3.7 3.5 - 5.0 g/dL   AST 19 15 - 41 U/L   ALT 13 (L) 14 - 54 U/L   Alkaline Phosphatase 62 38 - 126 U/L   Total Bilirubin 0.8 0.3 - 1.2 mg/dL   GFR calc non Af Amer 48 (L) >60 mL/min  GFR calc Af Amer 56 (L) >60 mL/min    Comment: (NOTE) The eGFR has been calculated using the CKD EPI equation. This calculation has not been validated in all clinical situations. eGFR's persistently <60 mL/min signify possible Chronic Kidney Disease.    Anion gap 9 5 - 15  CK     Status: None   Collection Time: 09/30/17 10:50 AM  Result Value Ref Range   Total CK 55 38 - 234 U/L  Troponin I     Status: None   Collection Time: 09/30/17  4:02 PM  Result Value Ref Range   Troponin I <0.03 <0.03 ng/mL  Troponin I     Status: None   Collection Time: 09/30/17  9:29 PM  Result Value Ref Range   Troponin I <0.03 <0.03 ng/mL  Troponin I     Status: None   Collection Time: 10/01/17  3:35 AM  Result Value Ref Range   Troponin I <0.03 <0.03 ng/mL  TSH     Status: None   Collection Time: 10/01/17  3:35 AM  Result Value Ref Range   TSH 0.867 0.350 - 4.500 uIU/mL    Comment: Performed by a 3rd Generation assay with a functional sensitivity of <=0.01 uIU/mL.  CBC     Status: Abnormal   Collection Time: 10/01/17  3:35 AM  Result Value Ref Range   WBC 4.8 3.6 - 11.0 K/uL   RBC 3.83 3.80 - 5.20 MIL/uL   Hemoglobin 12.1 12.0 - 16.0 g/dL   HCT 35.5 35.0 - 47.0 %   MCV 92.9 80.0 - 100.0 fL   MCH 31.5 26.0 - 34.0 pg   MCHC 33.9 32.0 - 36.0 g/dL   RDW 14.3 11.5 - 14.5 %   Platelets 99 (L) 150 - 440 K/uL  Comprehensive metabolic panel     Status: Abnormal   Collection Time: 10/01/17  3:35 AM  Result Value Ref Range   Sodium 142 135 - 145 mmol/L   Potassium 4.0 3.5 - 5.1 mmol/L   Chloride 112 (H) 101 -  111 mmol/L   CO2 25 22 - 32 mmol/L   Glucose, Bld 167 (H) 65 - 99 mg/dL   BUN 19 6 - 20 mg/dL   Creatinine, Ser 0.95 0.44 - 1.00 mg/dL   Calcium 8.3 (L) 8.9 - 10.3 mg/dL   Total Protein 5.3 (L) 6.5 - 8.1 g/dL   Albumin 3.0 (L) 3.5 - 5.0 g/dL   AST 15 15 - 41 U/L   ALT 10 (L) 14 - 54 U/L   Alkaline Phosphatase 49 38 - 126 U/L   Total Bilirubin 0.3 0.3 - 1.2 mg/dL   GFR calc non Af Amer 56 (L) >60 mL/min   GFR calc Af Amer >60 >60 mL/min    Comment: (NOTE) The eGFR has been calculated using the CKD EPI equation. This calculation has not been validated in all clinical situations. eGFR's persistently <60 mL/min signify possible Chronic Kidney Disease.    Anion gap 5 5 - 15  Glucose, capillary     Status: Abnormal   Collection Time: 10/01/17  4:42 AM  Result Value Ref Range   Glucose-Capillary 161 (H) 65 - 99 mg/dL    Current Facility-Administered Medications  Medication Dose Route Frequency Provider Last Rate Last Dose  . clopidogrel (PLAVIX) tablet 75 mg  75 mg Oral Daily Gouru, Aruna, MD   75 mg at 10/01/17 1153  . [START ON 10/02/2017] digoxin (LANOXIN) 0.25 MG/ML injection 0.125 mg  0.125  mg Intravenous Daily Gollan, Kathlene November, MD      . diltiazem (CARDIZEM CD) 24 hr capsule 120 mg  120 mg Oral Daily Vaughan Basta, MD   120 mg at 10/01/17 1153  . enoxaparin (LOVENOX) injection 40 mg  40 mg Subcutaneous Q24H Gouru, Aruna, MD   40 mg at 09/30/17 2117  . metoprolol tartrate (LOPRESSOR) tablet 25 mg  25 mg Oral Q8H Gollan, Kathlene November, MD      . OLANZapine [facility]) tablet 2.5 mg  2.5 mg Oral QHS Gouru, Aruna, MD   2.5 mg at 09/30/17 2118  . ondansetron (ZOFRAN) tablet 4 mg  4 mg Oral Q6H PRN Gouru, Aruna, MD       Or  . ondansetron (ZOFRAN) injection 4 mg  4 mg Intravenous Q6H PRN Gouru, Aruna, MD      . potassium chloride SA (K-DUR,KLOR-CON) CR tablet 20 mEq  20 mEq Oral Daily Gouru, Aruna, MD   20 mEq at 10/01/17 1153  . pravastatin (PRAVACHOL) tablet 80 mg  80 mg Oral  Daily Gouru, Aruna, MD   80 mg at 10/01/17 1153  . sodium chloride flush (NS) 0.9 % injection 3 mL  3 mL Intravenous Q12H Gouru, Aruna, MD   3 mL at 09/30/17 1618    Musculoskeletal: Strength & Muscle Tone: within normal limits Gait & Station: normal Patient leans: N/A  Psychiatric Specialty Exam: Physical Exam  Nursing note and vitals reviewed. Constitutional: She appears well-developed and well-nourished.  HENT:  Head: Normocephalic and atraumatic.  Eyes: Conjunctivae are normal. Pupils are equal, round, and reactive to light.  Neck: Normal range of motion.  Respiratory: Effort normal. No respiratory distress.  GI: Soft.  Musculoskeletal: Normal range of motion.  Neurological: She is alert.  Skin: Skin is warm and dry.  Psychiatric: Her affect is blunt. Her speech is delayed and tangential. She is slowed. Thought content is paranoid. Cognition and memory are impaired. She expresses impulsivity and inappropriate judgment. She expresses no homicidal and no suicidal ideation. She exhibits abnormal recent memory.    Review of Systems  Constitutional: Negative.   HENT: Negative.   Eyes: Negative.   Respiratory: Negative.   Cardiovascular: Negative.   Gastrointestinal: Negative.   Musculoskeletal: Negative.   Skin: Negative.   Neurological: Negative.   Psychiatric/Behavioral: Negative for depression, hallucinations, memory loss, substance abuse and suicidal ideas. The patient is nervous/anxious. The patient does not have insomnia.     Blood pressure 105/71, pulse 88, temperature (!) 97.5 F (36.4 C), temperature source Oral, resp. rate 16, height _0  (1.702 m), weight 74.8 kg (165 lb), SpO2 97 %.Body mass index is 25.84 kg/m.  General Appearance: Casual  Eye Contact:  Fair  Speech:  Slow  Volume:  Decreased  Mood:  Anxious and Dysphoric  Affect:  Congruent  Thought Process:  Disorganized  Orientation:  Other:  Patient did know that she was in the [place] area but was  otherwise pretty confused about the whole situation  Thought Content:  Illogical, Paranoid Ideation and Tangential  Suicidal Thoughts:  No  Homicidal Thoughts:  No  Memory:  Immediate;   Fair Recent;   Poor Remote;   Fair  Judgement:  Impaired  Insight:  Lacking  Psychomotor Activity:  Decreased  Concentration:  Concentration: Fair  Recall:  Poor  Fund of Knowledge:  Fair  Language:  Good  Akathisia:  No  Handed:  Right  AIMS (if indicated):     Assets:  Communication Skills Housing Social  Support  ADL's:  Impaired  Cognition:  Impaired,  Moderate  Sleep:        Treatment Plan Summary: Daily contact with patient to assess and evaluate symptoms and progress in treatment, Medication management and Plan 76 year old woman with dementia who has had spells of agitation in the past.  Currently she is calm but her insight is quite poor and she has some vague paranoid negative feelings towards her children who are actually the only people helping to take care of her at this point.  Patient does not appear to have major depression although it would not at all be unreasonable to try adding antidepressant medicine if that could help with some of the irritability.  At this point however the patient is likely to be discharged from the hospital within the next couple days.  There is already an appointment scheduled for her to see Dr. Melrose Nakayama in 2 days.  Under those circumstances there is no reason for me to start any new psychiatric medicine.  I would not change to the olanzapine which I assume is of some benefit with sundowning.  Case reviewed with the daughter-in-law who has good insight and is cooperating appropriately with finding the best treatment and living situation for this patient.  I will follow up just as needed in the hospital.  Disposition: No evidence of imminent risk to self or others at present.   Patient does not meet criteria for psychiatric inpatient admission. Supportive therapy  provided about ongoing stressors.  Alethia Berthold, MD 10/01/2017 5:15 PM

## 2017-10-01 NOTE — Progress Notes (Signed)
PT Cancellation Note  Patient Details Name: Meghan Lloyd MRN: [id number] DOB: 10/28/1941   Cancelled Treatment:    Reason Eval/Treat Not Completed: Medical issues which prohibited therapy(Evaluation re-attempted.  Patient HR remains elevated at rest (120s); just received IV digoxin per primary RN.  Will re-attempt next date as medically appropriate.)   [name] H. Manson PasseyBrown, PT, DPT, NCS 10/01/17, 2:54 PM [phone]

## 2017-10-01 NOTE — Progress Notes (Signed)
MD notified of elevated HR and A-flutter. New orders will be placed. Will continue to monitor.

## 2017-10-01 NOTE — Care Management Obs Status (Signed)
MEDICARE OBSERVATION STATUS NOTIFICATION   Patient Details  Name: Meghan KettleDorothy Merlos MRN: [id number] Date of Birth: 02-29-1941   Medicare Observation Status Notification Given:  Yes    Gwenette GreetBrenda S Zonnique Norkus, RN 10/01/2017, 10:30 AM

## 2017-10-01 NOTE — Consult Note (Addendum)
Cardiology Consultation:   Patient ID: Meghan KettleDorothy Lloyd; [id number]; 1941/08/05   Admit date: 09/30/2017 Date of Consult: 10/01/2017  Primary Care Provider: [facility] Primary Cardiologist[facility] Requesting consult physician  Dr. Amado Coegouru Reason for consult: Atrial fibrillation/flutter with RVR, syncope    Patient Profile:   Meghan Lloyd is a 76 y.o. female with a hx of persistent atrial fib/flutter who is being seen today for the evaluation of syncope and atrial fib/flutter with RVR  History of Present Illness:   Meghan Lloyd presents to the hospital after episode of syncope Notes from emergency room physician and hospitalist service indicates she was getting a couple coffee when she had acute episode of syncope.  No family at the bedside to discuss Patient is a poor historian.  She does not know if her legs gave out or if she had loss of consciousness Reports her legs are weak in general  Previous episode of syncope per the patient several weeks ago when she was walking in her house May have hit her head, she does not remember Uncertain if she sought medical treatment at that time  Episode of weakness several months ago when she was outside playing football with children Legs give out had to sit down  Currently has a sitter in the room as legs are weak and she is a fall risk Difficult to obtain accurate history from her, some confusion   Long discussion concerning her medications.  Uncertain if she is taking these appropriately  Vitals reviewed, orthostatics performed in the hospital showing no significant decline in her blood pressure she did receive IV fluids in the emergency room and currently on IV fluid.   Telemetry reviewed showing atrial fibrillation flutter heart rate up to 150 bpm while awake even with minimal movement, Improved heart rate less than 100 bpm when sleeping  Past Medical History:  Diagnosis Date  . Atrial fibrillation (HCC)   . Diabetes  mellitus without complication (HCC)   . Hypertension     History reviewed. No pertinent surgical history.   Home Medications:  Prior to Admission medications   Medication Sig Start Date End Date Taking? Authorizing Provider  clopidogrel (PLAVIX) 75 MG tablet Take 75 mg by mouth daily.   Yes [provider]  diltiazem (DILACOR XR) 180 MG 24 hr capsule Take 180 mg by mouth daily.   Yes [provider]  OLANZapine (ZYPREXA) 2.5 MG tablet Take 2.5 mg by mouth at bedtime.   Yes [provider]  potassium chloride SA (K-DUR,KLOR-CON) 20 MEQ tablet Take 20 mEq by mouth daily.   Yes [provider]  pravastatin (PRAVACHOL) 80 MG tablet Take 80 mg by mouth daily.   Yes [provider]    Inpatient Medications: Scheduled Meds: . clopidogrel  75 mg Oral Daily  . diltiazem  120 mg Oral Daily  . enoxaparin (LOVENOX) injection  40 mg Subcutaneous Q24H  . OLANZapine  2.5 mg Oral QHS  . potassium chloride SA  20 mEq Oral Daily  . pravastatin  80 mg Oral Daily  . sodium chloride flush  3 mL Intravenous Q12H   Continuous Infusions: . sodium chloride 75 mL/hr at 10/01/17 0604   PRN Meds: ondansetron **OR** ondansetron (ZOFRAN) IV  Allergies:   No Known Allergies  Social History:   Social History   Socioeconomic History  . Marital status: Widowed    Spouse name: Not on file  . Number of children: Not on file  . Years of education: Not  on file  . Highest education level: Not on file  Social Needs  . Financial resource strain: Not on file  . Food insecurity - worry: Not on file  . Food insecurity - inability: Not on file  . Transportation needs - medical: Not on file  . Transportation needs - non-medical: Not on file  Occupational History  . Not on file  Tobacco Use  . Smoking status: Current Every Day Smoker  . Smokeless tobacco: Never Used  Substance and Sexual Activity  . Alcohol use: Yes  . Drug use: Not on file  . Sexual activity:  Not on file  Other Topics Concern  . Not on file  Social History Narrative  . Not on file    Family History:   *No family history on file.   ROS:  Please see the history of present illness.  Review of Systems  Constitution: Negative for diaphoresis, fever, weakness, malaise/fatigue and night sweats.  HENT: Negative.   Eyes: Negative.   Cardiovascular: Negative for chest pain, claudication, cyanosis, dyspnea on exertion, irregular heartbeat, leg swelling, near-syncope, orthopnea, palpitations and paroxysmal nocturnal dyspnea.  Respiratory: Negative for cough, shortness of breath, sleep disturbances due to breathing and wheezing.   Endocrine: Negative.   Hematologic/Lymphatic: Negative.   Skin: Negative.   Musculoskeletal: Negative for falls, joint pain, joint swelling and myalgias.  Gastrointestinal: Negative.   Neurological: Positive for dizziness, light-headedness and loss of balance. Negative for difficulty with concentration, excessive daytime sleepiness, focal weakness and numbness.  Psychiatric/Behavioral: Negative.   All other ROS reviewed and negative.     Physical Exam/Data:   Vitals:   10/01/17 0347 10/01/17 0444 10/01/17 0843 10/01/17 1200  BP: (!) 141/62  (!) 118/53 132/88  Pulse: 73  61 (!) 147  Resp: 16  18   Temp: (!) 97.4 F (36.3 C)  98.8 F (37.1 C) 97.6 F (36.4 C)  TempSrc: Oral  Oral Oral  SpO2: 99%  99% 98%  Weight:  165 lb (74.8 kg)    Height:        Intake/Output Summary (Last 24 hours) at 10/01/2017 1315 Last data filed at 10/01/2017 1227 Gross per 24 hour  Intake 1424 ml  Output 700 ml  Net 724 ml   Filed Weights   09/30/17 1042 10/01/17 0444  Weight: 200 lb (90.7 kg) 165 lb (74.8 kg)   Body mass index is 25.84 kg/m.  General:  Well nourished, well developed, in no acute distress HEENT: normal Lymph: no adenopathy Neck: no JVD Endocrine:  No thryomegaly Vascular: No carotid bruits; FA pulses 2+ bilaterally without bruits  Cardiac:   Irregular RR, rapid,  no murmur  Lungs:  clear to auscultation bilaterally, no wheezing, rhonchi or rales  Abd: soft, nontender, no hepatomegaly  Ext: no edema Musculoskeletal:  No deformities, BUE and BLE strength normal and equal Skin: warm and dry  Neuro:  CNs 2-12 intact, no focal abnormalities noted Psych:  Normal affect  , confusion on providing history  EKG:  The EKG was personally reviewed and demonstrates:  Atrial fibrillation/flutter with ventricular rate 140 bpm right bundle branch block   Relevant CV Studies: Echocardiogram Normal ejection fraction, no significant valve disease, moderately dilated left atrium  Laboratory Data:  Chemistry Recent Labs  Lab 09/29/17 2015 09/30/17 1050 10/01/17 0335  NA 141 140 142  K 4.3 4.3 4.0  CL 105 105 112*  CO2 26 26 25   GLUCOSE 187* 200* 167*  BUN 25* 20 19  CREATININE 1.00  1.08* 0.95  CALCIUM 9.1 8.7* 8.3*  GFRNONAA 53* 48* 56*  GFRAA >60 56* >60  ANIONGAP 10 9 5     Recent Labs  Lab 09/30/17 1050 10/01/17 0335  PROT 6.2* 5.3*  ALBUMIN 3.7 3.0*  AST 19 15  ALT 13* 10*  ALKPHOS 62 49  BILITOT 0.8 0.3   Hematology Recent Labs  Lab 09/29/17 2015 09/30/17 1050 10/01/17 0335  WBC 6.2 5.1 4.8  RBC 4.12 4.20 3.83  HGB 13.3 13.4 12.1  HCT 38.3 39.1 35.5  MCV 92.9 93.0 92.9  MCH 32.3 32.0 31.5  MCHC 34.8 34.4 33.9  RDW 14.0 14.0 14.3  PLT 111* 112* 99*   Cardiac Enzymes Recent Labs  Lab 09/29/17 2015 09/30/17 1050 09/30/17 1602 09/30/17 2129 10/01/17 0335  TROPONINI <0.03 <0.03 <0.03 <0.03 <0.03   No results for input(s): TROPIPOC in the last 168 hours.  BNPNo results for input(s): BNP, PROBNP in the last 168 hours.  DDimer No results for input(s): DDIMER in the last 168 hours.  Radiology/Studies:  Dg Thoracic Spine 2 View  Result Date: 09/30/2017 CLINICAL DATA:  Upper back pain. EXAM: THORACIC SPINE 2 VIEWS COMPARISON:  None. FINDINGS: There is no evidence of thoracic spine fracture. Alignment is  normal. No other significant bone abnormalities are identified. IMPRESSION: Negative. Electronically Signed   By: Kennith CenterEric  Mansell M.D.   On: 09/30/2017 14:43   Ct Head Wo Contrast  Result Date: 09/30/2017 CLINICAL DATA:  Syncope with dizziness. EXAM: CT HEAD WITHOUT CONTRAST TECHNIQUE: Contiguous axial images were obtained from the base of the skull through the vertex without intravenous contrast. COMPARISON:  None. FINDINGS: Brain: There is no evidence for acute hemorrhage, hydrocephalus, mass lesion, or abnormal extra-axial fluid collection. No definite CT evidence for acute infarction. Diffuse loss of parenchymal volume is consistent with atrophy. Vascular: No hyperdense vessel or unexpected calcification. Skull: No evidence for fracture. No worrisome lytic or sclerotic lesion. Sinuses/Orbits: The visualized paranasal sinuses and mastoid air cells are clear. Visualized portions of the globes and intraorbital fat are unremarkable. Other: None. IMPRESSION: 1. No acute intracranial abnormality. Electronically Signed   By: Kennith CenterEric  Mansell M.D.   On: 09/30/2017 11:11   [place] Carotid Bilateral  Result Date: 10/01/2017 CLINICAL DATA:  Syncope EXAM: BILATERAL CAROTID DUPLEX ULTRASOUND TECHNIQUE: Wallace CullensGray scale imaging, color Doppler and duplex ultrasound were performed of bilateral carotid and vertebral arteries in the neck. COMPARISON:  None. FINDINGS: Criteria: Quantification of carotid stenosis is based on velocity parameters that correlate the residual internal carotid diameter with NASCET-based stenosis levels, using the diameter of the distal internal carotid lumen as the denominator for stenosis measurement. The following velocity measurements were obtained: RIGHT ICA:  64 cm/sec CCA:  69 cm/sec SYSTOLIC ICA/CCA RATIO:  0.9 DIASTOLIC ICA/CCA RATIO:  1.9 ECA:  78 cm/sec LEFT ICA:  87 cm/sec CCA:  49 cm/sec SYSTOLIC ICA/CCA RATIO:  1.8 DIASTOLIC ICA/CCA RATIO:  3.0 ECA:  99 cm/sec RIGHT CAROTID ARTERY: Mild irregular  soft plaque in the bulb. There is mild calcified plaque along the wall of the lower internal carotid artery. Low resistance internal carotid Doppler pattern is preserved. RIGHT VERTEBRAL ARTERY:  Antegrade. LEFT CAROTID ARTERY: Little if any plaque in the bulb. There is mild calcified plaque in the lower internal carotid artery. There is moderate irregular calcified plaque in the mid internal carotid artery. Low resistance internal carotid Doppler pattern is preserved. LEFT VERTEBRAL ARTERY:  Antegrade. IMPRESSION: Less than 50% stenosis in the right and left internal carotid  arteries. Electronically Signed   By: Jolaine Click M.D.   On: 10/01/2017 11:09   Dg Chest Portable 1 View  Result Date: 09/30/2017 CLINICAL DATA:  Syncope EXAM: PORTABLE CHEST 1 VIEW COMPARISON:  None. FINDINGS: Heart is borderline in size. Lungs are clear. No effusions or acute bony abnormality. IMPRESSION: Mild cardiomegaly.  No active disease. Electronically Signed   By: Charlett Nose M.D.   On: 09/30/2017 11:28    Assessment and Plan:   1. Syncope Patient is a poor historian, no family at the bedside Notes indicating that he was orthostatic at the scene This has resolved with IV fluids and saline infusion, based on recent orthostatic numbers Significant leg weakness also may be contributing Heart rate continues to run fast despite fluid resuscitation Echocardiogram with normal ejection fraction No ischemic workup needed at this time, cardiac enzymes negative x3 -Would request PT to determine if she is safe to be home Request visiting nurse to confirm she is taking medications at home appropriately -Also visiting nurse to monitor hydration, social situation  2.  Confusion  poor historian, raising concern for Dementia Records not available from primary care She reports having several episodes of syncope but details unclear, Unable to exclude mechanical cause for her events, leg weakness  3) atrial  fibrillation/flutter with RVR Unclear how long she has been in arrhythmia Not on anticoagulation. Reports this was discussed with primary care but she does not remember the details Again concerned about memory issues Unclear if she would be a candidate for anticoagulation as I am concerned about medication compliance at home as she lives by herself Ideally would like to start anticoagulation but fall risk unclear as well,  She reports legs keep giving out -chads vasc at least 17 (age, female, vascular disease/carotids hypertension) - will add digoxin 0.5 milligrams load followed by 0.25 mg daily for rate control  Continue diltiazem as blood pressure tolerates -add metoprolol 50 BID Consider anticoagulation if we can discuss with family  4) orthostasis Documented on notes from EMTs on arrival Concern for patient able to take care of herself and hydration BP Numbers have improved with IV fluid bolus in the emergency room and normal saline infusion.  Currently not orthostatic  Case discussed with social worker, Notes indicating she will go home with daughter after this hospitalization Need SNF?  Total encounter time more than 110 minutes  Greater than 50% was spent in counseling and coordination of care with the patient   For questions or updates, please contact [facility] Please consult [email or web address] for contact info under Cardiology/STEMI.   Signed, Julien Nordmann, MD  10/01/2017 1:15 PM

## 2017-10-01 NOTE — Progress Notes (Signed)
Just a Safety cas,e  No at suicidal case error TM NT/Sitter

## 2017-10-01 NOTE — Progress Notes (Signed)
PT Cancellation Note  Patient Details Name: Meghan Lloyd MRN: [id number] DOB: 24-Dec-1940   Cancelled Treatment:    Reason Eval/Treat Not Completed: Medical issues which prohibited therapy(Consult received and chart reviewed.  Upon entry to room, patient resting HR noted in 130-150s. RN informed/aware and to page attending physician.  Will hold evaluation at this time and re-attempt at later time/date as medically appropriate.)   [name], PT, DPT, NCS 10/01/17, 9:37 AM [phone]

## 2017-10-01 NOTE — Progress Notes (Signed)
Cardio MD notified of elevated HR.New orders given.

## 2017-10-01 NOTE — Progress Notes (Signed)
[facility] [facility] [place] at [facility]   PATIENT NAME: Taziyah Iannuzzi    MR#:  [id number]  DATE OF BIRTH:  [birth year]  SUBJECTIVE:  CHIEF COMPLAINT:   Chief Complaint  Patient presents with  . Loss of Consciousness    Had multiple episodes of Syncope. Had recently lost her son and husband, as per her. And moved to [place] to live with her daughter in law. IV fluids received, no complains now.  REVIEW OF SYSTEMS:  CONSTITUTIONAL: No fever, fatigue or weakness. Had syncopal episodes. EYES: No blurred or double vision.  EARS, NOSE, AND THROAT: No tinnitus or ear pain.  RESPIRATORY: No cough, shortness of breath, wheezing or hemoptysis.  CARDIOVASCULAR: No chest pain, orthopnea, edema.  GASTROINTESTINAL: No nausea, vomiting, diarrhea or abdominal pain.  GENITOURINARY: No dysuria, hematuria.  ENDOCRINE: No polyuria, nocturia,  HEMATOLOGY: No anemia, easy bruising or bleeding SKIN: No rash or lesion. MUSCULOSKELETAL: No joint pain or arthritis.   NEUROLOGIC: No tingling, numbness, weakness.  PSYCHIATRY: No anxiety or depression.   ROS  DRUG ALLERGIES:  No Known Allergies  VITALS:  Blood pressure 105/71, pulse 88, temperature (!) 97.5 F (36.4 C), temperature source Oral, resp. rate 16, height 5\' 7"  (1.702 m), weight 74.8 kg (165 lb), SpO2 97 %.  PHYSICAL EXAMINATION:  GENERAL:  76 y.o.-year-old patient lying in the bed with no acute distress.  EYES: Pupils equal, round, reactive to light and accommodation. No scleral icterus. Extraocular muscles intact.  HEENT: Head atraumatic, normocephalic. Oropharynx and nasopharynx clear.  NECK:  Supple, no jugular venous distention. No thyroid enlargement, no tenderness.  LUNGS: Normal breath sounds bilaterally, no wheezing, rales,rhonchi or crepitation. No use of accessory muscles of respiration.  CARDIOVASCULAR: S1, S2 fast . No murmurs, rubs, or gallops.  ABDOMEN: Soft, nontender, nondistended. Bowel sounds present. No  organomegaly or mass.  EXTREMITIES: No pedal edema, cyanosis, or clubbing.  NEUROLOGIC: Cranial nerves II through XII are intact. Muscle strength 4-5/5 in all extremities. Sensation intact. Gait not checked.  PSYCHIATRIC: The patient is alert and oriented x 3. Have some signs of depression. SKIN: No obvious rash, lesion, or ulcer.   Physical Exam LABORATORY PANEL:   CBC Recent Labs  Lab 10/01/17 0335  WBC 4.8  HGB 12.1  HCT 35.5  PLT 99*   ------------------------------------------------------------------------------------------------------------------  Chemistries  Recent Labs  Lab 10/01/17 0335  NA 142  K 4.0  CL 112*  CO2 25  GLUCOSE 167*  BUN 19  CREATININE 0.95  CALCIUM 8.3*  AST 15  ALT 10*  ALKPHOS 49  BILITOT 0.3   ------------------------------------------------------------------------------------------------------------------  Cardiac Enzymes Recent Labs  Lab 09/30/17 2129 10/01/17 0335  TROPONINI <0.03 <0.03   ------------------------------------------------------------------------------------------------------------------  RADIOLOGY:  Dg Thoracic Spine 2 View  Result Date: 09/30/2017 CLINICAL DATA:  Upper back pain. EXAM: THORACIC SPINE 2 VIEWS COMPARISON:  None. FINDINGS: There is no evidence of thoracic spine fracture. Alignment is normal. No other significant bone abnormalities are identified. IMPRESSION: Negative. Electronically Signed   By: Kennith Center M.D.   On: 09/30/2017 14:43   Ct Head Wo Contrast  Result Date: 09/30/2017 CLINICAL DATA:  Syncope with dizziness. EXAM: CT HEAD WITHOUT CONTRAST TECHNIQUE: Contiguous axial images were obtained from the base of the skull through the vertex without intravenous contrast. COMPARISON:  None. FINDINGS: Brain: There is no evidence for acute hemorrhage, hydrocephalus, mass lesion, or abnormal extra-axial fluid collection. No definite CT evidence for acute infarction. Diffuse loss of parenchymal volume  is consistent with atrophy.  Vascular: No hyperdense vessel or unexpected calcification. Skull: No evidence for fracture. No worrisome lytic or sclerotic lesion. Sinuses/Orbits: The visualized paranasal sinuses and mastoid air cells are clear. Visualized portions of the globes and intraorbital fat are unremarkable. Other: None. IMPRESSION: 1. No acute intracranial abnormality. Electronically Signed   By: Kennith CenterEric  Mansell M.D.   On: 09/30/2017 11:11   [place] Carotid Bilateral  Result Date: 10/01/2017 CLINICAL DATA:  Syncope EXAM: BILATERAL CAROTID DUPLEX ULTRASOUND TECHNIQUE: Wallace CullensGray scale imaging, color Doppler and duplex ultrasound were performed of bilateral carotid and vertebral arteries in the neck. COMPARISON:  None. FINDINGS: Criteria: Quantification of carotid stenosis is based on velocity parameters that correlate the residual internal carotid diameter with NASCET-based stenosis levels, using the diameter of the distal internal carotid lumen as the denominator for stenosis measurement. The following velocity measurements were obtained: RIGHT ICA:  64 cm/sec CCA:  69 cm/sec SYSTOLIC ICA/CCA RATIO:  0.9 DIASTOLIC ICA/CCA RATIO:  1.9 ECA:  78 cm/sec LEFT ICA:  87 cm/sec CCA:  49 cm/sec SYSTOLIC ICA/CCA RATIO:  1.8 DIASTOLIC ICA/CCA RATIO:  3.0 ECA:  99 cm/sec RIGHT CAROTID ARTERY: Mild irregular soft plaque in the bulb. There is mild calcified plaque along the wall of the lower internal carotid artery. Low resistance internal carotid Doppler pattern is preserved. RIGHT VERTEBRAL ARTERY:  Antegrade. LEFT CAROTID ARTERY: Little if any plaque in the bulb. There is mild calcified plaque in the lower internal carotid artery. There is moderate irregular calcified plaque in the mid internal carotid artery. Low resistance internal carotid Doppler pattern is preserved. LEFT VERTEBRAL ARTERY:  Antegrade. IMPRESSION: Less than 50% stenosis in the right and left internal carotid arteries. Electronically Signed   By: Jolaine ClickArthur  Hoss  M.D.   On: 10/01/2017 11:09   Dg Chest Portable 1 View  Result Date: 09/30/2017 CLINICAL DATA:  Syncope EXAM: PORTABLE CHEST 1 VIEW COMPARISON:  None. FINDINGS: Heart is borderline in size. Lungs are clear. No effusions or acute bony abnormality. IMPRESSION: Mild cardiomegaly.  No active disease. Electronically Signed   By: Charlett NoseKevin  Dover M.D.   On: 09/30/2017 11:28    ASSESSMENT AND PLAN:   Principal Problem:   Dementia with behavioral disturbance Active Problems:   Syncope  # Syncope acute on chronic-probably from orthostatic hypotension    Also have A fib with RVR. telemetry monitoring Gentle hydration with IV fluids Check orthostatics- not much drop today. Echocardiogram and carotid Dopplers CT head, chest x-ray are negative PT evaluation Cardiology consulted to rule out cardiogenic syncope Cycle cardiac biomarkers- remained negative. Checked x-ray of the thoracic spine as patient is complaining of upper back pain- no acute findings.  #Chronic atrial fibrillation now RVR On Cardizem 180 mg, dose reduced to 120 mg with hypotension. Called cardio consult. started digoxin.  #Diabetes mellitus Sliding scale insulin  #Essential hypertension Blood pressure is stable.    IV fluids.    All the records are reviewed and case discussed with Care Management/Social Workerr. Management plans discussed with the patient, family and they are in agreement.  CODE STATUS: full.  TOTAL TIME TAKING CARE OF THIS PATIENT: 35 minutes.    POSSIBLE D/C IN 1-2 DAYS, DEPENDING ON CLINICAL CONDITION.   Altamese DillingVaibhavkumar Shukri Nistler M.D on 10/01/2017   Between 7am to 6pm - Pager - [phone]  After 6pm go to [email or web address] - password [email or web address]  [facility]  Office  [phone]  CC: Primary care physician; [facility]  Note: This dictation was prepared with Dragon dictation along with  smaller phrase technology. Any transcriptional errors that result from this  process are unintentional.

## 2017-10-01 NOTE — Progress Notes (Signed)
*  PRELIMINARY RESULTS* Echocardiogram 2D Echocardiogram has been performed.  Cristela BlueHege, My Rinke 10/01/2017, 11:32 AM

## 2017-10-01 NOTE — Care Management Note (Signed)
Case Management Note  Patient Details  Name: Meghan Lloyd MRN: [id number] Date of Birth: 12-29-1940  Subjective/Objective:                  [facility] under observation status with the diagnosis of confusion. Daughter is [name] [phone]). [facility]. [place] and sees a Dr. [place] PootSandu. No home oxygen. Uses a cane to aid in ambulation.  [facility]. [facility] to provide [facility]ation and Physical [facility]  Action/Plan: Will continue to follow for discharge plans.  Physical therapy evaluation pending   Expected Discharge Date:                  Expected Discharge Plan:     In-House Referral:     Discharge planning Services     Post Acute Care Choice:    Choice offered to:     DME Arranged:    DME Agency:     HH Arranged:    HH Agency:     Status of Service:     If discussed at [employer] Meetings, dates discussed:    Additional Comments:  Gwenette GreetBrenda S Sade Hollon, RN MSN CCM Care Management [phone] 10/01/2017, 10:30 AM

## 2017-10-02 DIAGNOSIS — R55 Syncope and collapse: Secondary | ICD-10-CM | POA: Diagnosis not present

## 2017-10-02 DIAGNOSIS — I4891 Unspecified atrial fibrillation: Secondary | ICD-10-CM | POA: Diagnosis not present

## 2017-10-02 LAB — GLUCOSE, CAPILLARY
GLUCOSE-CAPILLARY: 158 mg/dL — AB (ref 65–99)
GLUCOSE-CAPILLARY: 170 mg/dL — AB (ref 65–99)

## 2017-10-02 MED ORDER — OLANZAPINE 5 MG PO TABS
5.0000 mg | ORAL_TABLET | Freq: Every day | ORAL | Status: DC
  Administered 2017-10-02 – 2017-10-04 (×3): 5 mg via ORAL
  Filled 2017-10-02 (×4): qty 1

## 2017-10-02 MED ORDER — HALOPERIDOL LACTATE 5 MG/ML IJ SOLN
1.0000 mg | Freq: Four times a day (QID) | INTRAMUSCULAR | Status: DC | PRN
Start: 2017-10-02 — End: 2017-10-05
  Administered 2017-10-04: 1 mg via INTRAVENOUS
  Filled 2017-10-02 (×2): qty 0.2

## 2017-10-02 NOTE — Progress Notes (Signed)
Progress Note  Patient Name: Meghan Lloyd Date of Encounter: 10/02/2017  Primary Cardiologist: New to [facility]  Subjective   No acute overnight events. Remains in Afib, ventricular rates well controlled. Echo as below.   Inpatient Medications    Scheduled Meds: . clopidogrel  75 mg Oral Daily  . digoxin  0.125 mg Intravenous Daily  . diltiazem  120 mg Oral Daily  . enoxaparin (LOVENOX) injection  40 mg Subcutaneous Q24H  . metoprolol tartrate  50 mg Oral BID  . OLANZapine  2.5 mg Oral QHS  . potassium chloride SA  20 mEq Oral Daily  . pravastatin  80 mg Oral Daily  . sodium chloride flush  3 mL Intravenous Q12H   Continuous Infusions:  PRN Meds: ondansetron **OR** ondansetron (ZOFRAN) IV   Vital Signs    Vitals:   10/02/17 0019 10/02/17 0500 10/02/17 0611 10/02/17 0803  BP: (!) 129/53  (!) 144/56 (!) 129/53  Pulse: (!) 57  (!) 55 (!) 144  Resp:   17   Temp:   98.8 F (37.1 C)   TempSrc:      SpO2:   98%   Weight:  163 lb 14.4 oz (74.3 kg)    Height:        Intake/Output Summary (Last 24 hours) at 10/02/2017 0902 Last data filed at 10/02/2017 4782 Gross per 24 hour  Intake 994 ml  Output 950 ml  Net 44 ml   Filed Weights   09/30/17 1042 10/01/17 0444 10/02/17 0500  Weight: 200 lb (90.7 kg) 165 lb (74.8 kg) 163 lb 14.4 oz (74.3 kg)    Telemetry    Afib, 70s bpm - Personally Reviewed  ECG    n/a - Personally Reviewed  Physical Exam   GEN: No acute distress.   Neck: No JVD. Cardiac: Irregularly irregular, I/VI systolic murmur at apex, no rubs, or gallops.  Respiratory: Clear to auscultation bilaterally.  GI: Soft, nontender, non-distended.   MS: No edema; No deformity. Neuro:  Alert and oriented x 3; Nonfocal.  Psych: Normal affect.  Labs    Chemistry Recent Labs  Lab 09/29/17 2015 09/30/17 1050 10/01/17 0335  NA 141 140 142  K 4.3 4.3 4.0  CL 105 105 112*  CO2 26 26 25   GLUCOSE 187* 200* 167*  BUN 25* 20 19  CREATININE 1.00 1.08*  0.95  CALCIUM 9.1 8.7* 8.3*  PROT  --  6.2* 5.3*  ALBUMIN  --  3.7 3.0*  AST  --  19 15  ALT  --  13* 10*  ALKPHOS  --  62 49  BILITOT  --  0.8 0.3  GFRNONAA 53* 48* 56*  GFRAA >60 56* >60  ANIONGAP 10 9 5      Hematology Recent Labs  Lab 09/29/17 2015 09/30/17 1050 10/01/17 0335  WBC 6.2 5.1 4.8  RBC 4.12 4.20 3.83  HGB 13.3 13.4 12.1  HCT 38.3 39.1 35.5  MCV 92.9 93.0 92.9  MCH 32.3 32.0 31.5  MCHC 34.8 34.4 33.9  RDW 14.0 14.0 14.3  PLT 111* 112* 99*    Cardiac Enzymes Recent Labs  Lab 09/30/17 1050 09/30/17 1602 09/30/17 2129 10/01/17 0335  TROPONINI <0.03 <0.03 <0.03 <0.03   No results for input(s): TROPIPOC in the last 168 hours.   BNPNo results for input(s): BNP, PROBNP in the last 168 hours.   DDimer No results for input(s): DDIMER in the last 168 hours.   Radiology    Dg Thoracic Spine 2  View  Result Date: 09/30/2017 CLINICAL DATA:  Upper back pain. EXAM: THORACIC SPINE 2 VIEWS COMPARISON:  None. FINDINGS: There is no evidence of thoracic spine fracture. Alignment is normal. No other significant bone abnormalities are identified. IMPRESSION: Negative. Electronically Signed   By: Kennith CenterEric  Mansell M.D.   On: 09/30/2017 14:43   Ct Head Wo Contrast  Result Date: 09/30/2017 CLINICAL DATA:  Syncope with dizziness. EXAM: CT HEAD WITHOUT CONTRAST TECHNIQUE: Contiguous axial images were obtained from the base of the skull through the vertex without intravenous contrast. COMPARISON:  None. FINDINGS: Brain: There is no evidence for acute hemorrhage, hydrocephalus, mass lesion, or abnormal extra-axial fluid collection. No definite CT evidence for acute infarction. Diffuse loss of parenchymal volume is consistent with atrophy. Vascular: No hyperdense vessel or unexpected calcification. Skull: No evidence for fracture. No worrisome lytic or sclerotic lesion. Sinuses/Orbits: The visualized paranasal sinuses and mastoid air cells are clear. Visualized portions of the globes  and intraorbital fat are unremarkable. Other: None. IMPRESSION: 1. No acute intracranial abnormality. Electronically Signed   By: Kennith CenterEric  Mansell M.D.   On: 09/30/2017 11:11   [place] Carotid Bilateral  Result Date: 10/01/2017 IMPRESSION: Less than 50% stenosis in the right and left internal carotid arteries. Electronically Signed   By: Jolaine ClickArthur  Hoss M.D.   On: 10/01/2017 11:09   Dg Chest Portable 1 View  Result Date: 09/30/2017 IMPRESSION: Mild cardiomegaly.  No active disease. Electronically Signed   By: Charlett NoseKevin  Dover M.D.   On: 09/30/2017 11:28    Cardiac Studies   TTE 10/01/2017: Study Conclusions  - Left ventricle: The cavity size was normal. There was moderate   concentric hypertrophy. Systolic function was normal. The   estimated ejection fraction was in the range of 50% to 55%. Wall   motion was normal; there were no regional wall motion   abnormalities. - Aortic valve: There was mild regurgitation. - Mitral valve: There was mild regurgitation. - Left atrium: The atrium was moderately dilated. - Right atrium: The atrium was moderately dilated. - Tricuspid valve: There was moderate regurgitation. - Pulmonary arteries: Systolic pressure was mildly increased. PA   peak pressure: 38 mm Hg (S). - Pericardium, extracardiac: A trivial pericardial effusion was   identified posterior to the heart.  Patient Profile     76 y.o. female with history of persistent Afib.flutter, frequent falls, possible dementia, and syncope who was admitted for syncope felt to be 2/2 orthostasis.   Assessment & Plan    1. Syncope: -No further events -Possibly in the setting of orthostasis -Given well controlled ventricular rates on metoprolol 25 mg bid, would be cautious with increase to 50 mg bid, defer to IM   2. Afib: -Uncertain duration -She is unlikely to be a good candidate for anticoagulation given her frequent falls and concern for underlying dementia -She is asymptomatic in Afib at this time  with stable vitals -Plan for rate control only -Continue Cardizem and Lopressor -Consider escalation of CCB/BB over digoxin   3. Confusion: -Uncertain baseline -Remains unclear if she would be a good candidate for full dose anticoagulation  4. Medication management: -Unclear why she is on Plavix at home -She does not know why, though reports she may have a stroke in the past, and denies any any family that would know about this medication -Defer management of this to IM given there does not appear to be any cardiac reason for her to be on this medication  5. Orthostasis/frequent falss: -Seen by PT -Unlikely  to be a good candidate for anticagulation   For questions or updates, please contact [facility] Please consult [email or web address] for contact info under Cardiology/STEMI.    Signed, Eula Listenyan Lynore Coscia, PA-C [facility] Pager: [phone] 10/02/2017, 9:02 AM

## 2017-10-02 NOTE — Evaluation (Signed)
Physical Therapy Evaluation Patient Details Name: Meghan KettleDorothy Lloyd MRN: [id number] DOB: 12/08/1940 Today's Date: 10/02/2017   History of Present Illness  Pt is a76 y.o.femalewith a known history of chronic atrial flutter ablation, diabetes mellitus, hypertension and 2 month history of syncope was [facility] and was discharged to rehabilitation center.Recently released from the rehabilitation center and living with the daughter-in-law and had a syncopal episode. Patient was feeling dizzy and then about to fall to the ground but son caught her on time and patient did not sustain any injuries. CT head in the ED is negative. According to the EMS patient was significantly hypotensive when she stood up. Patient was also becoming tachycardic whenever she stands up and heart rate went up to 130s to 140 according to the EMS.Patient denies any nausea vomiting or diarrhea. Denies any chest pain.  Assessment includes: Syncope, confusion, A-fib/flutter with RVR, and orthostasis.     Clinical Impression  Pt presents with deficits in transfers, gait, and balance.  Pt's baseline vitals at rest on room air include HR 70 bpm, SpO2 96%, and BP 120/56 mmHg.  Pt's BP in standing dropped to 100/47 mmHg with SpO2 and HR WNL with no adverse symptoms noted.  After amb and measured in standing pt's BP dropped to 95/48 mmHg with HR and SpO2 WNL and without adverse symptoms.  Pt did require cues during amb to stay within the RW during turns for decreased fall risk.  Pt steady in standing without UE support with wide stance but presented with posterior LOB noted during SLS and with feet together with eyes closed both requiring min A to correct.  Pt will benefit from HHPT services upon discharge to safely address above deficits for decreased caregiver assistance and eventual return to PLOF.      Follow Up Recommendations Home health PT;Supervision/Assistance - 24 hour    Equipment  Recommendations  None recommended by PT    Recommendations for Other Services       Precautions / Restrictions Precautions Precautions: Fall Restrictions Weight Bearing Restrictions: No      Mobility  Bed Mobility Overal bed mobility: Independent             General bed mobility comments: Good effort and speed with bed mobility tasks  Transfers Overall transfer level: Needs assistance Equipment used: Rolling walker (2 wheeled) Transfers: Sit to/from Stand Sit to Stand: Supervision         General transfer comment: Pt overall steady in standing with UE support but some instability during balance assessment including posterior LOB requiring min A to correct during attempts at SLS on both the R and L LEs.   Ambulation/Gait Ambulation/Gait assistance: Supervision Ambulation Distance (Feet): 200 Feet Assistive device: Rolling walker (2 wheeled) Gait Pattern/deviations: Step-through pattern   Gait velocity interpretation: Below normal speed for age/gender General Gait Details: Fair cadence during amb with no LOB  Stairs            Wheelchair Mobility    Modified Rankin (Stroke Patients Only)       Balance Overall balance assessment: Needs assistance Sitting-balance support: No upper extremity supported Sitting balance-Leahy Scale: Good     Standing balance support: No upper extremity supported Standing balance-Leahy Scale: Fair Standing balance comment: Pt steady in standing without UE support with wide stance but presented with posterior LOB noted during SLS and feet together with eyes closed both requiring min A to correct  Pertinent Vitals/Pain Pain Assessment: No/denies pain    Home Living Family/patient expects to be discharged to:: Private residence(Pt reports lives alone with chart review supporting pt lives with son and daughter in law) Living Arrangements: Children Available Help at Discharge:  Family Type of Home: House Home Access: Stairs to enter Entrance Stairs-Rails: Right(Pt unsure of rail situation at home) Secretary/administratorntrance Stairs-Number of Steps: 3 Home Layout: One level Home Equipment: Environmental consultantWalker - 2 wheels;Cane - single point      Prior Function Level of Independence: Independent         Comments: Pt reports Ind amb without AD community distances, Ind with ADLs, no fall history, and an active lifestyle including exercise at a local gym     Hand Dominance   Dominant Hand: Right    Extremity/Trunk Assessment   Upper Extremity Assessment Upper Extremity Assessment: Overall WFL for tasks assessed    Lower Extremity Assessment Lower Extremity Assessment: Overall WFL for tasks assessed       Communication   Communication: No difficulties  Cognition Arousal/Alertness: Awake/alert Behavior During Therapy: WFL for tasks assessed/performed Overall Cognitive Status: No family/caregiver present to determine baseline cognitive functioning                                        General Comments      Exercises Total Joint Exercises Ankle Circles/Pumps: Strengthening;Both;15 reps Towel Squeeze: Strengthening;Both;10 reps Long Arc Quad: AROM;Both;20 reps Knee Flexion: AROM;Both;20 reps Marching in Standing: AROM;Both;10 reps(Seated marching)   Assessment/Plan    PT Assessment Patient needs continued PT services  PT Problem List Decreased balance;Decreased knowledge of use of DME;Decreased safety awareness       PT Treatment Interventions      PT Goals (Current goals can be found in the Care Plan section)  Acute Rehab PT Goals Patient Stated Goal: To return home PT Goal Formulation: With patient Time For Goal Achievement: 10/15/17 Potential to Achieve Goals: Good    Frequency Min 2X/week   Barriers to discharge        Co-evaluation               AM-PAC PT "6 Clicks" Daily Activity  Outcome Measure Difficulty turning over in bed  (including adjusting bedclothes, sheets and blankets)?: None Difficulty moving from lying on back to sitting on the side of the bed? : None Difficulty sitting down on and standing up from a chair with arms (e.g., wheelchair, bedside commode, etc,.)?: None Help needed moving to and from a bed to chair (including a wheelchair)?: None Help needed walking in hospital room?: A Little Help needed climbing 3-5 steps with a railing? : A Little 6 Click Score: 22    End of Session Equipment Utilized During Treatment: Gait belt Activity Tolerance: Patient tolerated treatment well Patient left: in chair;with call bell/phone within reach;with chair alarm set;Other (comment)(Electronic sitter in room) Nurse Communication: Mobility status;Other (comment)(vital sign response to activity and position) PT Visit Diagnosis: Unsteadiness on feet (R26.81);Difficulty in walking, not elsewhere classified (R26.2)    Time: [id number] PT Time Calculation (min) (ACUTE ONLY): 42 min   Charges:   PT Evaluation $PT Eval Low Complexity: 1 Low PT Treatments $Therapeutic Exercise: 8-22 mins   PT G Codes:   PT G-Codes **NOT FOR INPATIENT CLASS** Functional Assessment Tool Used: AM-PAC 6 Clicks Basic Mobility Functional Limitation: Mobility: Walking and moving around Mobility: Walking and Moving Around  Current Status [phone]): At least 20 percent but less than 40 percent impaired, limited or restricted Mobility: Walking and Moving Around Goal Status [phone]): 0 percent impaired, limited or restricted    D. Elly Modena PT, DPT 10/02/17, 3:56 PM

## 2017-10-02 NOTE — Progress Notes (Signed)
[employer] [facility] [place] [facility]   PATIENT NAME: Meghan KettleDorothy Picco    MR#:  [id number]  DATE OF BIRTH:  1941-01-20  SUBJECTIVE:  CHIEF COMPLAINT:   Chief Complaint  Patient presents with  . Loss of Consciousness    Had multiple episodes of Syncope. Had recently lost her son and husband, as per her. And [place] to live with her daughter in law. IV fluids received, no complains now. Confused due to dementia  Have significant drop in orthostatic.  REVIEW OF SYSTEMS:  CONSTITUTIONAL: No fever, fatigue or weakness. Had syncopal episodes. EYES: No blurred or double vision.  EARS, NOSE, AND THROAT: No tinnitus or ear pain.  RESPIRATORY: No cough, shortness of breath, wheezing or hemoptysis.  CARDIOVASCULAR: No chest pain, orthopnea, edema.  GASTROINTESTINAL: No nausea, vomiting, diarrhea or abdominal pain.  GENITOURINARY: No dysuria, hematuria.  ENDOCRINE: No polyuria, nocturia,  HEMATOLOGY: No anemia, easy bruising or bleeding SKIN: No rash or lesion. MUSCULOSKELETAL: No joint pain or arthritis.   NEUROLOGIC: No tingling, numbness, weakness.  PSYCHIATRY: No anxiety or depression.   ROS  DRUG ALLERGIES:  No Known Allergies  VITALS:  Blood pressure (!) 127/100, pulse 70, temperature 97.6 F (36.4 C), temperature source Oral, resp. rate 17, height 5\' 7"  (1.702 m), weight 74.3 kg (163 lb 14.4 oz), SpO2 96 %.  PHYSICAL EXAMINATION:  GENERAL:  76 y.o.-year-old patient lying in the bed with no acute distress.  EYES: Pupils equal, round, reactive to light and accommodation. No scleral icterus. Extraocular muscles intact.  HEENT: Head atraumatic, normocephalic. Oropharynx and nasopharynx clear.  NECK:  Supple, no jugular venous distention. No thyroid enlargement, no tenderness.  LUNGS: Normal breath sounds bilaterally, no wheezing, rales,rhonchi or crepitation. No use of accessory muscles of respiration.  CARDIOVASCULAR: S1, S2 fast . No murmurs, rubs, or  gallops.  ABDOMEN: Soft, nontender, nondistended. Bowel sounds present. No organomegaly or mass.  EXTREMITIES: No pedal edema, cyanosis, or clubbing.  NEUROLOGIC: Cranial nerves II through XII are intact. Muscle strength 4-5/5 in all extremities. Sensation intact. Gait not checked.  PSYCHIATRIC: The patient is alert and oriented x 3. Have some signs of depression. SKIN: No obvious rash, lesion, or ulcer.   Physical Exam LABORATORY PANEL:   CBC Recent Labs  Lab 10/01/17 0335  WBC 4.8  HGB 12.1  HCT 35.5  PLT 99*   ------------------------------------------------------------------------------------------------------------------  Chemistries  Recent Labs  Lab 10/01/17 0335  NA 142  K 4.0  CL 112*  CO2 25  GLUCOSE 167*  BUN 19  CREATININE 0.95  CALCIUM 8.3*  AST 15  ALT 10*  ALKPHOS 49  BILITOT 0.3   ------------------------------------------------------------------------------------------------------------------  Cardiac Enzymes Recent Labs  Lab 09/30/17 2129 10/01/17 0335  TROPONINI <0.03 <0.03   ------------------------------------------------------------------------------------------------------------------  RADIOLOGY:  [place] Carotid Bilateral  Result Date: 10/01/2017 CLINICAL DATA:  Syncope EXAM: BILATERAL CAROTID DUPLEX ULTRASOUND TECHNIQUE: Wallace CullensGray scale imaging, color Doppler and duplex ultrasound were performed of bilateral carotid and vertebral arteries in the neck. COMPARISON:  None. FINDINGS: Criteria: Quantification of carotid stenosis is based on velocity parameters that correlate the residual internal carotid diameter with NASCET-based stenosis levels, using the diameter of the distal internal carotid lumen as the denominator for stenosis measurement. The following velocity measurements were obtained: RIGHT ICA:  64 cm/sec CCA:  69 cm/sec SYSTOLIC ICA/CCA RATIO:  0.9 DIASTOLIC ICA/CCA RATIO:  1.9 ECA:  78 cm/sec LEFT ICA:  87 cm/sec CCA:  49 cm/sec SYSTOLIC  ICA/CCA RATIO:  1.8 DIASTOLIC ICA/CCA  RATIO:  3.0 ECA:  99 cm/sec RIGHT CAROTID ARTERY: Mild irregular soft plaque in the bulb. There is mild calcified plaque along the wall of the lower internal carotid artery. Low resistance internal carotid Doppler pattern is preserved. RIGHT VERTEBRAL ARTERY:  Antegrade. LEFT CAROTID ARTERY: Little if any plaque in the bulb. There is mild calcified plaque in the lower internal carotid artery. There is moderate irregular calcified plaque in the mid internal carotid artery. Low resistance internal carotid Doppler pattern is preserved. LEFT VERTEBRAL ARTERY:  Antegrade. IMPRESSION: Less than 50% stenosis in the right and left internal carotid arteries. Electronically Signed   By: Jolaine ClickArthur  Hoss M.D.   On: 10/01/2017 11:09    ASSESSMENT AND PLAN:   Principal Problem:   Dementia with behavioral disturbance Active Problems:   Syncope  # Syncope acute on chronic-probably from orthostatic hypotension    Also have A fib with RVR. telemetry monitoring Gentle hydration with IV fluids Check orthostatics- much drop today. Echocardiogram and carotid Dopplers CT head, chest x-ray are negative PT evaluation Cardiology consulted to rule out cardiogenic syncope Cycle cardiac biomarkers- remained negative. Checked x-ray of the thoracic spine as patient is complaining of upper back pain- no acute findings.  #Chronic atrial fibrillation now RVR On Cardizem 180 mg, dose reduced to 120 mg with hypotension. Called cardio consult. started digoxin. Also started on metoprolol, which is able to control the rate and cardio increased dose today, but I am worried about hypotension. No anticoagulation due to falls.  #Diabetes mellitus Sliding scale insulin  #Essential hypertension Blood pressure is stable.    IV fluids.  PT eval.  All the records are reviewed and case discussed with Care Management/Social Workerr. Management plans discussed with the patient, family and  they are in agreement.  CODE STATUS: full.  TOTAL TIME TAKING CARE OF THIS PATIENT: 35 minutes.    POSSIBLE D/C IN 1-2 DAYS, DEPENDING ON CLINICAL CONDITION.   Altamese DillingVaibhavkumar Rennie Hack M.D on 10/02/2017   Between 7am to 6pm - Pager - [phone]  After 6pm go to [email or web address] - password [email or web address]  [facility] [place] [facility]  Office  [phone]  CC: Primary care physician; [facility]  Note: This dictation was prepared with Dragon dictation along with smaller phrase technology. Any transcriptional errors that result from this process are unintentional.

## 2017-10-02 NOTE — Progress Notes (Signed)
Patient ID: Meghan Lloyd, female   DOB: October 27, 1941, 76 y.o.   MRN: [id number]  I am the covering physician called by the nursing staff about patient stating she is going to jump out the window and that she also has a gun at home.  I ordered as needed Haldol.  Increase Zyprexa to 5 mg nightly.  Nurse concerned and asked for suicide precautions. I also put in psychiatric consultation  Dr. Renae GlossWieting

## 2017-10-02 NOTE — Progress Notes (Addendum)
Assessed pt for SI/HI and pt responded "Well I'm not sure what you consider harming myself but I am not going to discuss that right now. I am going to get some sleep and we will discuss that when I wake up." Will reassess. 1:1 sitter remains at bedside for safety.

## 2017-10-02 NOTE — Progress Notes (Signed)
At bedside with primary nurse, Ree KidaJack- Encouraging patient to stay in bed to prevent fall. Patient stated that in the event she did fall and harm herself, she had a " 38 at home and would use it on herself". Patient asked by this nurse if she was SI or HI, patient denies but states to "keep them out of my way".  CNA, Marchelle Folksmanda,  reported that patient had also made previous statement of jumping out the window. Primary RN, Ree KidaJack updated.

## 2017-10-03 DIAGNOSIS — I4891 Unspecified atrial fibrillation: Secondary | ICD-10-CM

## 2017-10-03 DIAGNOSIS — G301 Alzheimer's disease with late onset: Secondary | ICD-10-CM | POA: Diagnosis not present

## 2017-10-03 DIAGNOSIS — F0281 Dementia in other diseases classified elsewhere with behavioral disturbance: Secondary | ICD-10-CM

## 2017-10-03 DIAGNOSIS — R55 Syncope and collapse: Secondary | ICD-10-CM | POA: Diagnosis not present

## 2017-10-03 LAB — GLUCOSE, CAPILLARY: Glucose-Capillary: 172 mg/dL — ABNORMAL HIGH (ref 65–99)

## 2017-10-03 MED ORDER — DOCUSATE SODIUM 100 MG PO CAPS
200.0000 mg | ORAL_CAPSULE | Freq: Two times a day (BID) | ORAL | Status: DC
  Administered 2017-10-03 – 2017-10-05 (×5): 200 mg via ORAL
  Filled 2017-10-03 (×5): qty 2

## 2017-10-03 MED ORDER — MIDODRINE HCL 5 MG PO TABS
10.0000 mg | ORAL_TABLET | Freq: Three times a day (TID) | ORAL | Status: DC
  Administered 2017-10-04 – 2017-10-05 (×6): 10 mg via ORAL
  Filled 2017-10-03 (×6): qty 2

## 2017-10-03 MED ORDER — POLYETHYLENE GLYCOL 3350 17 G PO PACK
17.0000 g | PACK | Freq: Every day | ORAL | Status: DC
  Administered 2017-10-03 – 2017-10-05 (×3): 17 g via ORAL
  Filled 2017-10-03 (×3): qty 1

## 2017-10-03 NOTE — Progress Notes (Signed)
[employer] [facility] [place] at [facility]   PATIENT NAME: Meghan Lloyd    MR#:  [id number]  DATE OF BIRTH:  09-Jul-1941  SUBJECTIVE:  CHIEF COMPLAINT:   Chief Complaint  Patient presents with  . Loss of Consciousness  Yesterday patient breath suicidal ideation today she is feeling depressed but denies suicidal ideation  REVIEW OF SYSTEMS:  CONSTITUTIONAL: No fever, fatigue or weakness. Had syncopal episodes. EYES: No blurred or double vision.  EARS, NOSE, AND THROAT: No tinnitus or ear pain.  RESPIRATORY: No cough, shortness of breath, wheezing or hemoptysis.  CARDIOVASCULAR: No chest pain, orthopnea, edema.  GASTROINTESTINAL: No nausea, vomiting, diarrhea or abdominal pain.  GENITOURINARY: No dysuria, hematuria.  ENDOCRINE: No polyuria, nocturia,  HEMATOLOGY: No anemia, easy bruising or bleeding SKIN: No rash or lesion. MUSCULOSKELETAL: No joint pain or arthritis.   NEUROLOGIC: No tingling, numbness, weakness.  PSYCHIATRY: No anxiety or depression.   ROS  DRUG ALLERGIES:  No Known Allergies  VITALS:  Blood pressure 112/61, pulse 66, temperature 97.6 F (36.4 C), temperature source Oral, resp. rate 14, height 5\' 7"  (1.702 m), weight 162 lb 6.4 oz (73.7 kg), SpO2 100 %.  PHYSICAL EXAMINATION:  GENERAL:  76 y.o.-year-old patient lying in the bed with no acute distress.  EYES: Pupils equal, round, reactive to light and accommodation. No scleral icterus. Extraocular muscles intact.  HEENT: Head atraumatic, normocephalic. Oropharynx and nasopharynx clear.  NECK:  Supple, no jugular venous distention. No thyroid enlargement, no tenderness.  LUNGS: Normal breath sounds bilaterally, no wheezing, rales,rhonchi or crepitation. No use of accessory muscles of respiration.  CARDIOVASCULAR: S1, S2 fast . No murmurs, rubs, or gallops.  ABDOMEN: Soft, nontender, nondistended. Bowel sounds present. No organomegaly or mass.  EXTREMITIES: No pedal edema, cyanosis, or clubbing.   NEUROLOGIC: Cranial nerves II through XII are intact. Muscle strength 4-5/5 in all extremities. Sensation intact. Gait not checked.  PSYCHIATRIC: The patient is alert and oriented x 3. Have some signs of depression. SKIN: No obvious rash, lesion, or ulcer.   Physical Exam LABORATORY PANEL:   CBC Recent Labs  Lab 10/01/17 0335  WBC 4.8  HGB 12.1  HCT 35.5  PLT 99*   ------------------------------------------------------------------------------------------------------------------  Chemistries  Recent Labs  Lab 10/01/17 0335  NA 142  K 4.0  CL 112*  CO2 25  GLUCOSE 167*  BUN 19  CREATININE 0.95  CALCIUM 8.3*  AST 15  ALT 10*  ALKPHOS 49  BILITOT 0.3   ------------------------------------------------------------------------------------------------------------------  Cardiac Enzymes Recent Labs  Lab 09/30/17 2129 10/01/17 0335  TROPONINI <0.03 <0.03   ------------------------------------------------------------------------------------------------------------------  RADIOLOGY:  No results found.  ASSESSMENT AND PLAN:   Principal Problem:   Dementia with behavioral disturbance Active Problems:   Syncope  # Syncope acute on chronic-probably from orthostatic hypotension    Also have A fib with RVR. telemetry monitoring Gentle hydration with IV fluids Orthostatic continue to be positive: I will start patient on midodrine  CT head, chest x-ray are negative PT evaluation Cardiology consulted appreciated   #Chronic atrial fibrillation with RVR during hospitalization On Cardizem 180 mg, dose reduced to 120 mg with hypotension. Appreciate cardio consult. Continue digoxin. Also started on metoprolol, which is able to control the rate and cardio increased dose today, No anticoagulation due to falls.  #Diabetes mellitus Sliding scale insulin  #Essential hypertension Blood pressure is stable.    IV fluids.  #Depression and suicidal ideation psychiatry  to receive the patient  PT eval.  All the records are  reviewed and case discussed with Care Management/Social Workerr. Management plans discussed with the patient, family and they are in agreement.  CODE STATUS: full.  TOTAL TIME TAKING CARE OF THIS PATIENT: 35 minutes.    POSSIBLE D/C IN 1-2 DAYS, DEPENDING ON CLINICAL CONDITION.   Auburn BilberryPATEL, Jossette Zirbel M.D on 10/03/2017   Between 7am to 6pm - Pager - [phone]  After 6pm go to [email or web address] - password [email or web address]  [facility] [place] [facility]  Office  [phone]  CC: Primary care physician; [facility]  Note: This dictation was prepared with Dragon dictation along with smaller phrase technology. Any transcriptional errors that result from this process are unintentional.

## 2017-10-03 NOTE — Progress Notes (Signed)
Physical Therapy Treatment Patient Details Name: Meghan KettleDorothy Lloyd MRN: [id number] DOB: [birth year] Today's Date: 10/03/2017    History of Present Illness Pt is a76 y.o.femalewith a known history of chronic atrial flutter ablation, diabetes mellitus, hypertension and 2 month history of syncope [facility] and was discharged to rehabilitation center.Recently released from the rehabilitation center and living with the daughter-in-law and had a syncopal episode. Patient was feeling dizzy and then about to fall to the ground but son caught her on time and patient did not sustain any injuries. CT head in the ED is negative. According to the EMS patient was significantly hypotensive when she stood up. Patient was also becoming tachycardic whenever she stands up and heart rate went up to 130s to 140 according to the EMS.Patient denies any nausea vomiting or diarrhea. Denies any chest pain.  Assessment includes: Syncope, confusion, A-fib/flutter with RVR, and orthostasis.     PT Comments    Pt continues to present with deficits in gait, balance, and activity tolerance.  Pt motivated to participate with rehab and tolerated supine and seated therapeutic exercises well with no adverse symptoms.  Vital signs taken frequently during session with HR in the 70s and SpO2 97% throughout session on room air.  BP in supine 136/58 mmHg without adverse symptoms, 119/53 mmHg without adverse symptoms, and then 88/43 mmHg with c/o mild dizziness and lightheadedness.  Pt returned to sitting with symptoms quickly resolving.  Nursing notified of pt's symptomatic orthostatic hypotension with gait deferred this session.  Pt will benefit from HHPT services upon discharge to safely address above deficits for decreased caregiver assistance and eventual return to PLOF.    Follow Up Recommendations  Home health PT     Equipment Recommendations  None recommended by PT    Recommendations for  Other Services       Precautions / Restrictions Precautions Precautions: Fall Restrictions Weight Bearing Restrictions: No    Mobility  Bed Mobility Overal bed mobility: Independent             General bed mobility comments: Good effort and speed with bed mobility tasks  Transfers Overall transfer level: Needs assistance Equipment used: Rolling walker (2 wheeled) Transfers: Sit to/from Stand Sit to Stand: Supervision         General transfer comment: Pt overall steady in standing with UE support   Ambulation/Gait             General Gait Details: No Amb this session secondary to symptomatic orthostatic hypotension   Stairs            Wheelchair Mobility    Modified Rankin (Stroke Patients Only)       Balance Overall balance assessment: Needs assistance Sitting-balance support: No upper extremity supported;Feet supported;Feet unsupported Sitting balance-Leahy Scale: Good     Standing balance support: Bilateral upper extremity supported Standing balance-Leahy Scale: Fair                              Cognition Arousal/Alertness: Awake/alert Behavior During Therapy: WFL for tasks assessed/performed Overall Cognitive Status: No family/caregiver present to determine baseline cognitive functioning                                        Exercises Total Joint Exercises Ankle Circles/Pumps: Strengthening;Both;5 reps;10 reps Quad Sets: Strengthening;Both;10  reps Gluteal Sets: Strengthening;Both;10 reps Towel Squeeze: Strengthening;Both;10 reps Short Arc Quad: AROM;Both;10 reps Heel Slides: AROM;Both;5 reps Hip ABduction/ADduction: AROM;Both;10 reps Straight Leg Raises: AROM;Both;10 reps Long Arc Quad: AROM;Both;15 reps Knee Flexion: AROM;Both;15 reps Marching in Standing: AROM;Both;10 reps(In sitting) Bridges: Strengthening;Both;10 reps    General Comments        Pertinent Vitals/Pain Pain Assessment:  No/denies pain    Home Living                      Prior Function            PT Goals (current goals can now be found in the care plan section) Progress towards PT goals: Not progressing toward goals - comment(Limited by symptomatic orthostatic hypotension)    Frequency    Min 2X/week      PT Plan Current plan remains appropriate    Co-evaluation              AM-PAC PT "6 Clicks" Daily Activity  Outcome Measure                   End of Session Equipment Utilized During Treatment: Gait belt Activity Tolerance: Other (comment)(Pt limited by symptomatic orthostatic hypotension ) Patient left: in bed;with nursing/sitter in room;with call bell/phone within reach;Other (comment)(Pt left with sitter preparing for bathing) Nurse Communication: Mobility status;Other (comment)(Orthostatic results) PT Visit Diagnosis: Unsteadiness on feet (R26.81);Difficulty in walking, not elsewhere classified (R26.2)     Time: [id number] PT Time Calculation (min) (ACUTE ONLY): 23 min  Charges:  $Therapeutic Exercise: 8-22 mins $Therapeutic Activity: 8-22 mins                    G Codes:       Elly Modena. Scott Kartel Wolbert PT, DPT 10/03/17, 12:04 PM

## 2017-10-03 NOTE — Care Management (Signed)
Message left with Ar[facility] at [facility] to confirm is patient is active.

## 2017-10-03 NOTE — Progress Notes (Signed)
Notified MD about patient's HR in the 40's [facility] MD okay as far as patient sleeping and asymptomatic.

## 2017-10-03 NOTE — Progress Notes (Signed)
Progress Note  Patient Name: Meghan Lloyd Date of Encounter: 10/03/2017  Primary Cardiologist: New to [facility]  Subjective   Sleeping comfortably this morning Sitter at the bedside Notes indicating bradycardia overnight when sleeping asymptomatic,   Inpatient Medications    Scheduled Meds: . clopidogrel  75 mg Oral Daily  . digoxin  0.125 mg Intravenous Daily  . diltiazem  120 mg Oral Daily  . docusate sodium  200 mg Oral BID  . enoxaparin (LOVENOX) injection  40 mg Subcutaneous Q24H  . metoprolol tartrate  50 mg Oral BID  . OLANZapine  5 mg Oral QHS  . polyethylene glycol  17 g Oral Daily  . potassium chloride SA  20 mEq Oral Daily  . pravastatin  80 mg Oral Daily  . sodium chloride flush  3 mL Intravenous Q12H   Continuous Infusions:  PRN Meds: haloperidol lactate, ondansetron **OR** ondansetron (ZOFRAN) IV   Vital Signs    Vitals:   10/03/17 0500 10/03/17 0851 10/03/17 0853 10/03/17 0855  BP:  (!) 140/55 (!) 158/84 (!) 134/59  Pulse:  63 (!) 52 (!) 102  Resp:  14 14 14   Temp:      TempSrc:      SpO2:  96% 94% 94%  Weight: 162 lb 6.4 oz (73.7 kg)     Height:        Intake/Output Summary (Last 24 hours) at 10/03/2017 1050 Last data filed at 10/03/2017 1039 Gross per 24 hour  Intake 360 ml  Output 1300 ml  Net -940 ml   Filed Weights   10/01/17 0444 10/02/17 0500 10/03/17 0500  Weight: 165 lb (74.8 kg) 163 lb 14.4 oz (74.3 kg) 162 lb 6.4 oz (73.7 kg)    Telemetry    Afib, 50s to 100 bpm - Personally Reviewed Higher rates with exertion, walking  ECG    n/a - Personally Reviewed  Physical Exam   GEN: No acute distress.   Neck: No JVD. Cardiac: Irregularly irregular, I/VI systolic murmur at apex, no rubs, or gallops.  Respiratory: Clear to auscultation bilaterally.  GI: Soft, nontender, non-distended.   MS: No edema; No deformity. Neuro:   Not tested Psych: Sleeping  Labs    Chemistry Recent Labs  Lab 09/29/17 2015 09/30/17 1050  10/01/17 0335  NA 141 140 142  K 4.3 4.3 4.0  CL 105 105 112*  CO2 26 26 25   GLUCOSE 187* 200* 167*  BUN 25* 20 19  CREATININE 1.00 1.08* 0.95  CALCIUM 9.1 8.7* 8.3*  PROT  --  6.2* 5.3*  ALBUMIN  --  3.7 3.0*  AST  --  19 15  ALT  --  13* 10*  ALKPHOS  --  62 49  BILITOT  --  0.8 0.3  GFRNONAA 53* 48* 56*  GFRAA >60 56* >60  ANIONGAP 10 9 5      Hematology Recent Labs  Lab 09/29/17 2015 09/30/17 1050 10/01/17 0335  WBC 6.2 5.1 4.8  RBC 4.12 4.20 3.83  HGB 13.3 13.4 12.1  HCT 38.3 39.1 35.5  MCV 92.9 93.0 92.9  MCH 32.3 32.0 31.5  MCHC 34.8 34.4 33.9  RDW 14.0 14.0 14.3  PLT 111* 112* 99*    Cardiac Enzymes Recent Labs  Lab 09/30/17 1050 09/30/17 1602 09/30/17 2129 10/01/17 0335  TROPONINI <0.03 <0.03 <0.03 <0.03   No results for input(s): TROPIPOC in the last 168 hours.   BNPNo results for input(s): BNP, PROBNP in the last 168 hours.  DDimer No results for input(s): DDIMER in the last 168 hours.   Radiology    Dg Thoracic Spine 2 View  Result Date: 09/30/2017 CLINICAL DATA:  Upper back pain. EXAM: THORACIC SPINE 2 VIEWS COMPARISON:  None. FINDINGS: There is no evidence of thoracic spine fracture. Alignment is normal. No other significant bone abnormalities are identified. IMPRESSION: Negative. Electronically Signed   By: Kennith CenterEric  Mansell M.D.   On: 09/30/2017 14:43   Ct Head Wo Contrast  Result Date: 09/30/2017 CLINICAL DATA:  Syncope with dizziness. EXAM: CT HEAD WITHOUT CONTRAST TECHNIQUE: Contiguous axial images were obtained from the base of the skull through the vertex without intravenous contrast. COMPARISON:  None. FINDINGS: Brain: There is no evidence for acute hemorrhage, hydrocephalus, mass lesion, or abnormal extra-axial fluid collection. No definite CT evidence for acute infarction. Diffuse loss of parenchymal volume is consistent with atrophy. Vascular: No hyperdense vessel or unexpected calcification. Skull: No evidence for fracture. No  worrisome lytic or sclerotic lesion. Sinuses/Orbits: The visualized paranasal sinuses and mastoid air cells are clear. Visualized portions of the globes and intraorbital fat are unremarkable. Other: None. IMPRESSION: 1. No acute intracranial abnormality. Electronically Signed   By: Kennith CenterEric  Mansell M.D.   On: 09/30/2017 11:11   [place] Carotid Bilateral  Result Date: 10/01/2017 IMPRESSION: Less than 50% stenosis in the right and left internal carotid arteries. Electronically Signed   By: Jolaine ClickArthur  Hoss M.D.   On: 10/01/2017 11:09   Dg Chest Portable 1 View  Result Date: 09/30/2017 IMPRESSION: Mild cardiomegaly.  No active disease. Electronically Signed   By: Charlett NoseKevin  Dover M.D.   On: 09/30/2017 11:28    Cardiac Studies   TTE 10/01/2017: Study Conclusions  - Left ventricle: The cavity size was normal. There was moderate   concentric hypertrophy. Systolic function was normal. The   estimated ejection fraction was in the range of 50% to 55%. Wall   motion was normal; there were no regional wall motion   abnormalities. - Aortic valve: There was mild regurgitation. - Mitral valve: There was mild regurgitation. - Left atrium: The atrium was moderately dilated. - Right atrium: The atrium was moderately dilated. - Tricuspid valve: There was moderate regurgitation. - Pulmonary arteries: Systolic pressure was mildly increased. PA   peak pressure: 38 mm Hg (S). - Pericardium, extracardiac: A trivial pericardial effusion was   identified posterior to the heart.  Patient Profile     76 y.o. female with history of persistent Afib.flutter, frequent falls, possible dementia, and syncope who was admitted for syncope felt to be 2/2 orthostasis.   Assessment & Plan    1. Syncope: -Possibly in the setting of orthostasis,  patient poor historian,  Unable to exclude leg weakness and falls -Given well controlled ventricular rates on metoprolol 25 mg bid, would be cautious with increase to 50 mg bid, defer to  IM  2. Afib: -Uncertain duration Will need to monitor as an outpatient determine her fall risk after work with PT,  Patient reports significant leg weakness, recent falls unlikely to be a good candidate for anticoagulation exacerbated by medication noncompliance, confusion, rate well-controlled on current dose of metoprolol and diltiazem Suspect elevated heart rate on arrival secondary to medication noncompliance. Need family to be involved with her medications  3. Confusion: Monitored by psychiatry Outpatient follow-up with neurology   Total encounter time more than 25 minutes  Greater than 50% was spent in counseling and coordination of care with the patient   For questions or updates, please  contact [facility] Please consult [email or web address] for contact info under Cardiology/STEMI.    Signed, Eula Listenyan Dunn, PA-C [facility] Pager: [phone] 10/03/2017, 10:50 AM

## 2017-10-03 NOTE — Consult Note (Signed)
[facility] Face[facility] Consult   Reason for Consult: Repeat consult for 76 year old woman with history of dementia Referring Physician:  Hilton SinclairWeiting Patient Identification: Meghan KettleDorothy Lloyd MRN:  [id number] Principal Diagnosis: Dementia with behavioral disturbance Diagnosis:   Patient Active Problem List   Diagnosis Date Noted  . Dementia with behavioral disturbance [F03.91] 10/01/2017  . Syncope [R55] 09/30/2017    Total Time spent with patient: 45 minutes  Subjective:   Meghan KettleDorothy Lloyd is a 76 y.o. female patient admitted with "my daughter wanted me here".  HPI: Patient seen chart reviewed.  See previous notes.  I had seen this patient earlier in her stay and at that time found her to have significant dementia with some accompanying paranoia but no sign of major depression.  I was asked to reconsult yesterday because the patient had allegedly made statements about killing herself.  Today I found the patient sitting up finishing her supper.  She had no specific complaint.  Denied feeling depressed.  Totally denied any suicidal or homicidal thought.  Denied any hallucinations.  Patient is paranoid and confused.  Continues to think that [place] which is not true.  Continues to believe that her adopted son and his wife are somehow doing something malignant to her.  Social history: Patient is a widow.  Closest living relative is an adopted son and his daughter who are currently taking care of the patient.  Medical history: Multiple medical problems including history of recent syncope  Substance abuse history: None  Past Psychiatric History: Known history of dementia no history of suicide attempts no history of violence  Risk to Self: Is patient at risk for suicide?: No Risk to Others:   Prior Inpatient Therapy:   Prior Outpatient Therapy:    Past Medical History:  Past Medical History:  Diagnosis Date  . Atrial fibrillation (HCC)   . Diabetes mellitus  without complication (HCC)   . Hypertension    History reviewed. No pertinent surgical history. Family History: No family history on file. Family Psychiatric  History: None Social History:  Social History   Substance and Sexual Activity  Alcohol Use Yes     Social History   Substance and Sexual Activity  Drug Use Not on file    Social History   Socioeconomic History  . Marital status: Widowed    Spouse name: None  . Number of children: None  . Years of education: None  . Highest education level: None  Social Needs  . Financial resource strain: None  . Food insecurity - worry: None  . Food insecurity - inability: None  . Transportation needs - medical: None  . Transportation needs - non-medical: None  Occupational History  . None  Tobacco Use  . Smoking status: Current Every Day Smoker  . Smokeless tobacco: Never Used  Substance and Sexual Activity  . Alcohol use: Yes  . Drug use: None  . Sexual activity: None  Other Topics Concern  . None  Social History Narrative  . None   Additional Social History:    Allergies:  No Known Allergies  Labs:  Results for orders placed or performed during the hospital encounter of 09/30/17 (from the past 48 hour(s))  Glucose, capillary     Status: Abnormal   Collection Time: 10/02/17  6:12 AM  Result Value Ref Range   Glucose-Capillary 158 (H) 65 - 99 mg/dL  Glucose, capillary     Status: Abnormal   Collection Time: 10/02/17  7:39 AM  Result Value Ref Range   Glucose-Capillary 170 (H) 65 - 99 mg/dL   Comment 1 Notify RN   Glucose, capillary     Status: Abnormal   Collection Time: 10/03/17  4:56 AM  Result Value Ref Range   Glucose-Capillary 172 (H) 65 - 99 mg/dL    Current Facility-Administered Medications  Medication Dose Route Frequency Provider Last Rate Last Dose  . clopidogrel (PLAVIX) tablet 75 mg  75 mg Oral Daily Gouru, Aruna, MD   75 mg at 10/03/17 1056  . digoxin (LANOXIN) 0.25 MG/ML injection 0.125 mg   0.125 mg Intravenous Daily Antonieta Iba, MD   0.125 mg at 10/03/17 1105  . diltiazem (CARDIZEM CD) 24 hr capsule 120 mg  120 mg Oral Daily Altamese Dilling, MD   120 mg at 10/03/17 1056  . docusate sodium (COLACE) capsule 200 mg  200 mg Oral BID Auburn Bilberry, MD   200 mg at 10/03/17 1104  . enoxaparin (LOVENOX) injection 40 mg  40 mg Subcutaneous Q24H Gouru, Aruna, MD   40 mg at 10/03/17 1656  . haloperidol lactate (HALDOL) injection 1 mg  1 mg Intravenous Q6H PRN Wieting, Richard, MD      . metoprolol tartrate (LOPRESSOR) tablet 50 mg  50 mg Oral BID Antonieta Iba, MD   50 mg at 10/03/17 1056  . OLANZapine (ZYPREXA) tablet 5 mg  5 mg Oral QHS Alford Highland, MD   5 mg at 10/02/17 2210  . ondansetron (ZOFRAN) tablet 4 mg  4 mg Oral Q6H PRN Gouru, Aruna, MD       Or  . ondansetron (ZOFRAN) injection 4 mg  4 mg Intravenous Q6H PRN Gouru, Aruna, MD      . polyethylene glycol (MIRALAX / GLYCOLAX) packet 17 g  17 g Oral Daily Auburn Bilberry, MD   17 g at 10/03/17 1055  . potassium chloride SA (K-DUR,KLOR-CON) CR tablet 20 mEq  20 mEq Oral Daily Gouru, Aruna, MD   20 mEq at 10/03/17 1056  . pravastatin (PRAVACHOL) tablet 80 mg  80 mg Oral Daily Gouru, Aruna, MD   80 mg at 10/03/17 1056  . sodium chloride flush (NS) 0.9 % injection 3 mL  3 mL Intravenous Q12H Gouru, Aruna, MD   3 mL at 10/03/17 1056    Musculoskeletal: Strength & Muscle Tone: decreased Gait & Station: unsteady Patient leans: N/A  Psychiatric Specialty Exam: Physical Exam  Nursing note and vitals reviewed. Constitutional: She appears well-developed and well-nourished.  HENT:  Head: Normocephalic and atraumatic.  Eyes: Conjunctivae are normal. Pupils are equal, round, and reactive to light.  Neck: Normal range of motion.  Cardiovascular: Regular rhythm and normal heart sounds.  Respiratory: Effort normal. No respiratory distress.  GI: Soft.  Musculoskeletal: Normal range of motion.  Neurological: She is  alert.  Skin: Skin is warm and dry.  Psychiatric: Her affect is blunt. Her speech is delayed. She is not agitated, not aggressive, not hyperactive and not combative. Thought content is paranoid. Cognition and memory are impaired. She expresses inappropriate judgment. She expresses no homicidal and no suicidal ideation.    Review of Systems  Constitutional: Negative.   HENT: Negative.   Eyes: Negative.   Respiratory: Negative.   Cardiovascular: Negative.   Gastrointestinal: Negative.   Musculoskeletal: Negative.   Skin: Negative.   Neurological: Negative.   Psychiatric/Behavioral: Negative for depression, hallucinations, memory loss, substance abuse and suicidal ideas. The patient is not nervous/anxious and does not have insomnia.  Blood pressure 112/61, pulse 66, temperature 97.6 F (36.4 C), temperature source Oral, resp. rate 14, height 5\' 7"  (1.702 m), weight 73.7 kg (162 lb 6.4 oz), SpO2 100 %.Body mass index is 25.44 kg/m.  General Appearance: Casual  Eye Contact:  Good  Speech:  Slow  Volume:  Decreased  Mood:  Euthymic  Affect:  Constricted  Thought Process:  Goal Directed  Orientation:  Negative  Thought Content:  Illogical and Paranoid Ideation  Suicidal Thoughts:  No  Homicidal Thoughts:  No  Memory:  Immediate;   Fair Recent;   Poor Remote;   Poor  Judgement:  Impaired  Insight:  Lacking  Psychomotor Activity:  Decreased  Concentration:  Attention Span: Poor  Recall:  Poor  Fund of Knowledge:  Fair  Language:  Fair  Akathisia:  No  Handed:  Right  AIMS (if indicated):     Assets:  Desire for Improvement Housing Social Support  ADL's:  Impaired  Cognition:  Impaired,  Moderate  Sleep:        Treatment Plan Summary: Plan Consult for 76 year old woman with history of dementia.  Currently her mental status is identical to what it was last time I saw her.  No sign of depression.  No evidence to support worry about acute suicidality.  Patient can be taken  off of suicide precautions.  Unlikely to be much benefit from antipsychotic medication and the risk would be significantly increased of falls.  No change to any medication or treatment at this point.  Disposition: Patient does not meet criteria for psychiatric inpatient admission. Supportive therapy provided about ongoing stressors.  Mordecai RasmussenJohn Maikol Grassia, MD 10/03/2017 5:44 PM

## 2017-10-04 DIAGNOSIS — F0391 Unspecified dementia with behavioral disturbance: Secondary | ICD-10-CM | POA: Diagnosis present

## 2017-10-04 DIAGNOSIS — E119 Type 2 diabetes mellitus without complications: Secondary | ICD-10-CM | POA: Diagnosis present

## 2017-10-04 DIAGNOSIS — G301 Alzheimer's disease with late onset: Secondary | ICD-10-CM | POA: Diagnosis not present

## 2017-10-04 DIAGNOSIS — Z7902 Long term (current) use of antithrombotics/antiplatelets: Secondary | ICD-10-CM | POA: Diagnosis not present

## 2017-10-04 DIAGNOSIS — R296 Repeated falls: Secondary | ICD-10-CM | POA: Diagnosis present

## 2017-10-04 DIAGNOSIS — I481 Persistent atrial fibrillation: Secondary | ICD-10-CM | POA: Diagnosis present

## 2017-10-04 DIAGNOSIS — I4892 Unspecified atrial flutter: Secondary | ICD-10-CM | POA: Diagnosis present

## 2017-10-04 DIAGNOSIS — M546 Pain in thoracic spine: Secondary | ICD-10-CM | POA: Diagnosis present

## 2017-10-04 DIAGNOSIS — I10 Essential (primary) hypertension: Secondary | ICD-10-CM | POA: Diagnosis present

## 2017-10-04 DIAGNOSIS — I951 Orthostatic hypotension: Secondary | ICD-10-CM | POA: Diagnosis present

## 2017-10-04 DIAGNOSIS — F329 Major depressive disorder, single episode, unspecified: Secondary | ICD-10-CM | POA: Diagnosis present

## 2017-10-04 DIAGNOSIS — W19XXXA Unspecified fall, initial encounter: Secondary | ICD-10-CM | POA: Diagnosis present

## 2017-10-04 DIAGNOSIS — Z66 Do not resuscitate: Secondary | ICD-10-CM | POA: Diagnosis present

## 2017-10-04 DIAGNOSIS — R45851 Suicidal ideations: Secondary | ICD-10-CM | POA: Diagnosis present

## 2017-10-04 DIAGNOSIS — R55 Syncope and collapse: Secondary | ICD-10-CM | POA: Diagnosis present

## 2017-10-04 DIAGNOSIS — F17200 Nicotine dependence, unspecified, uncomplicated: Secondary | ICD-10-CM | POA: Diagnosis present

## 2017-10-04 DIAGNOSIS — F0281 Dementia in other diseases classified elsewhere with behavioral disturbance: Secondary | ICD-10-CM | POA: Diagnosis not present

## 2017-10-04 DIAGNOSIS — Z79899 Other long term (current) drug therapy: Secondary | ICD-10-CM | POA: Diagnosis not present

## 2017-10-04 DIAGNOSIS — Z9181 History of falling: Secondary | ICD-10-CM | POA: Diagnosis not present

## 2017-10-04 DIAGNOSIS — I252 Old myocardial infarction: Secondary | ICD-10-CM | POA: Diagnosis not present

## 2017-10-04 DIAGNOSIS — I482 Chronic atrial fibrillation: Secondary | ICD-10-CM | POA: Diagnosis present

## 2017-10-04 LAB — CORTISOL: CORTISOL PLASMA: 10.6 ug/dL

## 2017-10-04 LAB — GLUCOSE, CAPILLARY: GLUCOSE-CAPILLARY: 155 mg/dL — AB (ref 65–99)

## 2017-10-04 MED ORDER — METOPROLOL TARTRATE 25 MG PO TABS
25.0000 mg | ORAL_TABLET | Freq: Two times a day (BID) | ORAL | Status: DC
  Administered 2017-10-04 – 2017-10-05 (×3): 25 mg via ORAL
  Filled 2017-10-04 (×3): qty 1

## 2017-10-04 MED ORDER — METOPROLOL TARTRATE 25 MG PO TABS: 25.0000 mg | ORAL_TABLET | Freq: Two times a day (BID) | ORAL | Status: DC

## 2017-10-04 NOTE — Care Management (Signed)
Per [facility] patients start of care was [facility] for [facility] [place] [facility].  Message left for family to clarify.

## 2017-10-04 NOTE — Progress Notes (Signed)
Physical Therapy Treatment Patient Details Name: Meghan KettleDorothy Lloyd MRN: [id number] DOB: Jan 12, 1941 Today's Date: 10/04/2017    History of Present Illness Pt is a76 y.o.femalewith a known history of chronic atrial flutter ablation, diabetes mellitus, hypertension and 2 month history of syncope [place] [facility] [place] [facility] and was discharged to rehabilitation center.Recently released from the rehabilitation center and living with the daughter-in-law and had a syncopal episode. Patient was feeling dizzy and then about to fall to the ground but son caught her on time and patient did not sustain any injuries. CT head in the ED is negative. According to the EMS patient was significantly hypotensive when she stood up. Patient was also becoming tachycardic whenever she stands up and heart rate went up to 130s to 140 according to the EMS.Patient denies any nausea vomiting or diarrhea. Denies any chest pain.  Assessment includes: Syncope, confusion, A-fib/flutter with RVR, and orthostasis.     PT Comments    Pt did very well with ambulation and though she was distracted and at times needed reorientation she ultimately did well and showed good safety, confidence and consistent cadence with 2 loops around the nurses' station.  Pt also showed good effort with and was able to perform LE exercises in sitting w/o issue. Pt confused and rambling t/o the session, but eager to work with PT and able to follow all instructions well.   Follow Up Recommendations  Home health PT     Equipment Recommendations       Recommendations for Other Services       Precautions / Restrictions Precautions Precautions: Fall Restrictions Weight Bearing Restrictions: No    Mobility  Bed Mobility               General bed mobility comments: in recliner, not performed  Transfers Overall transfer level: Modified independent Equipment used: Rolling walker (2 wheeled) Transfers: Sit to/from  Stand Sit to Stand: Modified independent (Device/Increase time)         General transfer comment: Pt able to rise and maintain standing w/o AD  Ambulation/Gait Ambulation/Gait assistance: Supervision Ambulation Distance (Feet): 350 Feet Assistive device: Rolling walker (2 wheeled)       General Gait Details: Pt with confidence, consistent, appropriate ambulation.  HR slightly increased to 110s, but generally stayed in the 90s w/o c/o excessive pain   Stairs            Wheelchair Mobility    Modified Rankin (Stroke Patients Only)       Balance                                            Cognition Arousal/Alertness: Awake/alert Behavior During Therapy: Impulsive Overall Cognitive Status: History of cognitive impairments - at baseline                                 General Comments: pleasantly confused and rambling      Exercises General Exercises - Lower Extremity Ankle Circles/Pumps: AROM;10 reps Long Arc Quad: Strengthening;10 reps Heel Slides: Strengthening;10 reps Hip ABduction/ADduction: Strengthening;10 reps Hip Flexion/Marching: Strengthening;10 reps    General Comments        Pertinent Vitals/Pain Pain Assessment: No/denies pain    Home Living  Prior Function            PT Goals (current goals can now be found in the care plan section) Progress towards PT goals: Progressing toward goals    Frequency    Min 2X/week      PT Plan Current plan remains appropriate    Co-evaluation              AM-PAC PT "6 Clicks" Daily Activity  Outcome Measure  Difficulty turning over in bed (including adjusting bedclothes, sheets and blankets)?: None Difficulty moving from lying on back to sitting on the side of the bed? : None Difficulty sitting down on and standing up from a chair with arms (e.g., wheelchair, bedside commode, etc,.)?: None Help needed moving to and from a bed  to chair (including a wheelchair)?: None Help needed walking in hospital room?: None Help needed climbing 3-5 steps with a railing? : None 6 Click Score: 24    End of Session Equipment Utilized During Treatment: Gait belt Activity Tolerance: Patient tolerated treatment well Patient left: in chair;with nursing/sitter in room   PT Visit Diagnosis: Unsteadiness on feet (R26.81);Difficulty in walking, not elsewhere classified (R26.2)     Time: [id number] PT Time Calculation (min) (ACUTE ONLY): 15 min  Charges:  $Therapeutic Exercise: 8-22 mins                    G Codes:       Malachi ProGalen R Ante Arredondo, DPT 10/04/2017, 5:49 PM

## 2017-10-04 NOTE — Progress Notes (Signed)
Dr patel requested that knee high teds be placed on the patient

## 2017-10-04 NOTE — Progress Notes (Signed)
Patient remains confused. Patient will not stand for orthostatic vitals. Patient is now resting in bed in no apparent distress. Will continue to monitor patient.

## 2017-10-04 NOTE — Progress Notes (Signed)
[employer] [facility] [facility] [place] [facility]   PATIENT NAME: Meghan Lloyd    MR#:  [id number]  DATE OF BIRTH:  15-Jan-1941  SUBJECTIVE:  CHIEF COMPLAINT:   Chief Complaint  Patient presents with  . Loss of Consciousness   Patient had a 2.5 seconds pulse, noted on telemetry.  Her blood pressure continues to drop with standing Patient states that she is feeling great  REVIEW OF SYSTEMS:  CONSTITUTIONAL: No fever, fatigue or weakness. Had syncopal episodes. EYES: No blurred or double vision.  EARS, NOSE, AND THROAT: No tinnitus or ear pain.  RESPIRATORY: No cough, shortness of breath, wheezing or hemoptysis.  CARDIOVASCULAR: No chest pain, orthopnea, edema.  GASTROINTESTINAL: No nausea, vomiting, diarrhea or abdominal pain.  GENITOURINARY: No dysuria, hematuria.  ENDOCRINE: No polyuria, nocturia,  HEMATOLOGY: No anemia, easy bruising or bleeding SKIN: No rash or lesion. MUSCULOSKELETAL: No joint pain or arthritis.   NEUROLOGIC: No tingling, numbness, weakness.  PSYCHIATRY: No anxiety or depression.   ROS  DRUG ALLERGIES:  No Known Allergies  VITALS:  Blood pressure (!) 131/57, pulse 66, temperature 98.3 F (36.8 C), temperature source Oral, resp. rate 14, height 5\' 7"  (1.702 m), weight 162 lb 1.6 oz (73.5 kg), SpO2 97 %.  PHYSICAL EXAMINATION:  GENERAL:  76 y.o.-year-old patient lying in the bed with no acute distress.  EYES: Pupils equal, round, reactive to light and accommodation. No scleral icterus. Extraocular muscles intact.  HEENT: Head atraumatic, normocephalic. Oropharynx and nasopharynx clear.  NECK:  Supple, no jugular venous distention. No thyroid enlargement, no tenderness.  LUNGS: Normal breath sounds bilaterally, no wheezing, rales,rhonchi or crepitation. No use of accessory muscles of respiration.  CARDIOVASCULAR: S1, S2 fast . No murmurs, rubs, or gallops.  ABDOMEN: Soft, nontender, nondistended. Bowel sounds present. No organomegaly or mass.   EXTREMITIES: No pedal edema, cyanosis, or clubbing.  NEUROLOGIC: Cranial nerves II through XII are intact. Muscle strength 4-5/5 in all extremities. Sensation intact. Gait not checked.  PSYCHIATRIC: The patient is alert and oriented x 3. Have some signs of depression. SKIN: No obvious rash, lesion, or ulcer.   Physical Exam LABORATORY PANEL:   CBC Recent Labs  Lab 10/01/17 0335  WBC 4.8  HGB 12.1  HCT 35.5  PLT 99*   ------------------------------------------------------------------------------------------------------------------  Chemistries  Recent Labs  Lab 10/01/17 0335  NA 142  K 4.0  CL 112*  CO2 25  GLUCOSE 167*  BUN 19  CREATININE 0.95  CALCIUM 8.3*  AST 15  ALT 10*  ALKPHOS 49  BILITOT 0.3   ------------------------------------------------------------------------------------------------------------------  Cardiac Enzymes Recent Labs  Lab 09/30/17 2129 10/01/17 0335  TROPONINI <0.03 <0.03   ------------------------------------------------------------------------------------------------------------------  RADIOLOGY:  No results found.  ASSESSMENT AND PLAN:   Principal Problem:   Dementia with behavioral disturbance Active Problems:   Syncope   Syncope due to orthostatic hypotension  # Syncope acute on chronic-probably from orthostatic hypotension    Also have A fib with RVR. telemetry monitoring Gentle hydration with IV fluids Orthostatic continue to be positive: Patient already on midodrine CT head, chest x-ray are negative PT evaluation Cardiology consulted appreciated Metoprolol dose will be decreased   #Chronic atrial fibrillation with RVR during hospitalization Continue Cardizem Appreciate cardio consult. Per cardiology digoxin discontinued Metoprolol dose decreased   #Diabetes mellitus Sliding scale insulin  #Essential hypertension Blood pressure is is low Midodrine as above  #Depression and suicidal ideation  psychiatry seen the patient currently in better mood  PT eval.  All the records are  reviewed and case discussed with Care Management/Social Workerr. Management plans discussed with the patient, family and they are in agreement.  CODE STATUS: full.  TOTAL TIME TAKING CARE OF THIS PATIENT: 32 minutes.    POSSIBLE D/C IN 1-2 DAYS, DEPENDING ON CLINICAL CONDITION.   Auburn BilberryPATEL, Meghan Lloyd M.D on 10/04/2017   Between 7am to 6pm - Pager - [phone]  After 6pm go to [email or web address] - password [email or web address]  [facility]  Office  [phone]  CC: Primary care physician; [facility]  Note: This dictation was prepared with Dragon dictation along with smaller phrase technology. Any transcriptional errors that result from this process are unintentional.

## 2017-10-04 NOTE — Progress Notes (Signed)
Progress Note  Patient Name: Meghan Lloyd Date of Encounter: 10/04/2017  Primary Cardiologist: Julien Nordmannimothy Gollan, MD  Subjective   Pleasant.  No chest pain, sob, presyncope/syncope.  Remains orthostatic.  Inpatient Medications    Scheduled Meds: . clopidogrel  75 mg Oral Daily  . digoxin  0.125 mg Intravenous Daily  . diltiazem  120 mg Oral Daily  . docusate sodium  200 mg Oral BID  . enoxaparin (LOVENOX) injection  40 mg Subcutaneous Q24H  . metoprolol tartrate  50 mg Oral BID  . midodrine  10 mg Oral TID WC  . OLANZapine  5 mg Oral QHS  . polyethylene glycol  17 g Oral Daily  . potassium chloride SA  20 mEq Oral Daily  . pravastatin  80 mg Oral Daily  . sodium chloride flush  3 mL Intravenous Q12H   Continuous Infusions:  PRN Meds: haloperidol lactate, ondansetron **OR** ondansetron (ZOFRAN) IV   Vital Signs    Vitals:   10/04/17 0505 10/04/17 0751 10/04/17 0753 10/04/17 0756  BP: (!) 110/52 (!) 129/51 123/60 (!) 83/50  Pulse: (!) 54 (!) 52 (!) 52   Resp: 16 16    Temp: 98.6 F (37 C) (!) 97.5 F (36.4 C)    TempSrc: Oral Oral    SpO2: 97% 97% 97%   Weight: 162 lb 1.6 oz (73.5 kg)     Height:       Orthostatic VS for the past 24 hrs:  BP- Lying Pulse- Lying BP- Sitting Pulse- Sitting BP- Standing at 0 minutes Pulse- Standing at 0 minutes  10/03/17 1148 136/58 70 119/53 72 (!) 88/43 73     Intake/Output Summary (Last 24 hours) at 10/04/2017 1020 Last data filed at 10/04/2017 0930 Gross per 24 hour  Intake 840 ml  Output 2450 ml  Net -1610 ml   Filed Weights   10/02/17 0500 10/03/17 0500 10/04/17 0505  Weight: 163 lb 14.4 oz (74.3 kg) 162 lb 6.4 oz (73.7 kg) 162 lb 1.6 oz (73.5 kg)    Physical Exam   GEN: Well nourished, well developed, in no acute distress.  HEENT: Grossly normal.  Neck: Supple, no JVD, carotid bruits, or masses. Cardiac: IR, IR, no murmurs, rubs, or gallops. No clubbing, cyanosis, edema.  Radials/DP/PT 2+ and equal bilaterally.    Respiratory:  Respirations regular and unlabored, clear to auscultation bilaterally. GI: Soft, nontender, nondistended, BS + x 4. MS: no deformity or atrophy. Skin: warm and dry, no rash. Neuro:  Strength and sensation are intact. Psych: AAOx3.  Normal affect.  Labs    Chemistry Recent Labs  Lab 09/29/17 2015 09/30/17 1050 10/01/17 0335  NA 141 140 142  K 4.3 4.3 4.0  CL 105 105 112*  CO2 26 26 25   GLUCOSE 187* 200* 167*  BUN 25* 20 19  CREATININE 1.00 1.08* 0.95  CALCIUM 9.1 8.7* 8.3*  PROT  --  6.2* 5.3*  ALBUMIN  --  3.7 3.0*  AST  --  19 15  ALT  --  13* 10*  ALKPHOS  --  62 49  BILITOT  --  0.8 0.3  GFRNONAA 53* 48* 56*  GFRAA >60 56* >60  ANIONGAP 10 9 5      Hematology Recent Labs  Lab 09/29/17 2015 09/30/17 1050 10/01/17 0335  WBC 6.2 5.1 4.8  RBC 4.12 4.20 3.83  HGB 13.3 13.4 12.1  HCT 38.3 39.1 35.5  MCV 92.9 93.0 92.9  MCH 32.3 32.0 31.5  MCHC 34.8 34.4  33.9  RDW 14.0 14.0 14.3  PLT 111* 112* 99*    Cardiac Enzymes Recent Labs  Lab 09/30/17 1050 09/30/17 1602 09/30/17 2129 10/01/17 0335  TROPONINI <0.03 <0.03 <0.03 <0.03     Radiology    No results found.  Telemetry    Afib - 60's to 70's - Personally Reviewed  Cardiac Studies   TTE 10/01/2017: Study Conclusions   - Left ventricle: The cavity size was normal. There was moderate   concentric hypertrophy. Systolic function was normal. The   estimated ejection fraction was in the range of 50% to 55%. Wall   motion was normal; there were no regional wall motion   abnormalities. - Aortic valve: There was mild regurgitation. - Mitral valve: There was mild regurgitation. - Left atrium: The atrium was moderately dilated. - Right atrium: The atrium was moderately dilated. - Tricuspid valve: There was moderate regurgitation. - Pulmonary arteries: Systolic pressure was mildly increased. PA   peak pressure: 38 mm Hg (S). - Pericardium, extracardiac: A trivial pericardial effusion  was   identified posterior to the heart.   Patient Profile     76 y.o. female with history of persistent Afib.flutter, frequent falls, possible dementia, and syncope who was admitted for syncope felt to be 2/2 orthostasis.   Assessment & Plan    1.  Syncope/Orthostatic Hypotension:  No recurrent presyncope or syncope, though pt remains orthostatic - with systolics dropping from 136 (Lying)  119(Sit)  88(Stand).  Midodrine started this AM.  Follow.  2.  Persistent Afib:  Pt says she's been having palpitations for six months. Afib on presentation.  Well rate-controlled on  blocker, dilt, and digoxin.  Periodic bradycardia into the 50's noted.  With orthostasis, will back down on metoprolol to 25 bid.  Will need outpt f/u of digoxin level.  CHA2DS2VASc = 5, however, w/ h/o dementia and frequent falls, she is not likely to be a good candidate for OAC.  3. Dementia: stable.  Per psych/IM.  Signed, Nicolasa Duckinghristopher Anabia Weatherwax, NP  10/04/2017, 10:20 AM    For questions or updates, please contact   Please consult [email or web address] for contact info under Cardiology/STEMI.

## 2017-10-04 NOTE — Care Management (Signed)
Assessment completed via telephone with daughter in law [place]. [name] states that at discharge patient will return to Lisa's and [place].  They have Ms. Bartleson set up as a new patient to [facility].  [name] states that patient has access to RW and cane, however she refuses to use them.  Patient was [facility] health [facility] [place], however patient was not seen due to move and hospitalization.  [facility] [place] not [place]. [name] states that she does not have a preference for home health agency.  Heads up referral made to Sarah with [facility].  RNCM following for discharge planning needs.

## 2017-10-05 DIAGNOSIS — I951 Orthostatic hypotension: Principal | ICD-10-CM

## 2017-10-05 LAB — GLUCOSE, CAPILLARY: Glucose-Capillary: 147 mg/dL — ABNORMAL HIGH (ref 65–99)

## 2017-10-05 MED ORDER — QUETIAPINE FUMARATE 25 MG PO TABS: 12.5000 mg | ORAL_TABLET | Freq: Four times a day (QID) | ORAL | Status: DC | PRN

## 2017-10-05 MED ORDER — MIDODRINE HCL 10 MG PO TABS: 10.0000 mg | ORAL_TABLET | Freq: Three times a day (TID) | ORAL | 0 refills | Status: DC

## 2017-10-05 MED ORDER — METOPROLOL TARTRATE 25 MG PO TABS: 25.0000 mg | ORAL_TABLET | Freq: Two times a day (BID) | ORAL | 0 refills | Status: DC

## 2017-10-05 MED ORDER — QUETIAPINE FUMARATE 25 MG PO TABS: 12.5000 mg | ORAL_TABLET | Freq: Four times a day (QID) | ORAL | 0 refills | Status: DC | PRN

## 2017-10-05 NOTE — Progress Notes (Signed)
Progress Note  Patient Name: Meghan Lloyd Date of Encounter: 10/05/2017  Primary Cardiologist: Julien Nordmannimothy Gollan, MD  Subjective   No chest pain, sob, presyncope/syncope.   Heart rate 60s at rest  Rate 100 or less awake, sometimes higher with exertion 110-120   Inpatient Medications    Scheduled Meds: . clopidogrel  75 mg Oral Daily  . diltiazem  120 mg Oral Daily  . docusate sodium  200 mg Oral BID  . enoxaparin (LOVENOX) injection  40 mg Subcutaneous Q24H  . metoprolol tartrate  25 mg Oral BID  . midodrine  10 mg Oral TID WC  . OLANZapine  5 mg Oral QHS  . polyethylene glycol  17 g Oral Daily  . potassium chloride SA  20 mEq Oral Daily  . pravastatin  80 mg Oral Daily  . sodium chloride flush  3 mL Intravenous Q12H   Continuous Infusions:  PRN Meds: haloperidol lactate, ondansetron **OR** ondansetron (ZOFRAN) IV, QUEtiapine   Vital Signs    Vitals:   10/05/17 0516 10/05/17 0837 10/05/17 0910 10/05/17 1235  BP: (!) 111/57 (!) 107/44 (!) 101/51 124/62  Pulse: (!) 58 70 70 71  Resp: 16     Temp: (!) 97.5 F (36.4 C) 97.7 F (36.5 C)  (!) 97.4 F (36.3 C)  TempSrc: Oral Oral  Oral  SpO2: 98% 97%  99%  Weight: 165 lb 4.8 oz (75 kg)     Height:       Orthostatic VS for the past 24 hrs:  BP- Lying Pulse- Lying BP- Sitting Pulse- Sitting BP- Standing at 0 minutes Pulse- Standing at 0 minutes  10/05/17 0800 107/44 70 91/43 81 92/49 77  10/04/17 1600 131/57 66 121/57 92 92/49 77     Intake/Output Summary (Last 24 hours) at 10/05/2017 1300 Last data filed at 10/05/2017 1130 Gross per 24 hour  Intake 720 ml  Output 1050 ml  Net -330 ml   Filed Weights   10/03/17 0500 10/04/17 0505 10/05/17 0516  Weight: 162 lb 6.4 oz (73.7 kg) 162 lb 1.6 oz (73.5 kg) 165 lb 4.8 oz (75 kg)    Physical Exam   GEN: Well nourished, well developed, in no acute distress.  HEENT: Grossly normal.  Neck: Supple, no JVD, carotid bruits, or masses. Cardiac: irregularly irregular no  murmurs, rubs, or gallops. No clubbing, cyanosis, edema.  Radials/DP/PT 2+ and equal bilaterally.  Respiratory:  Respirations regular and unlabored, clear to auscultation bilaterally. GI: Soft, nontender, nondistended, BS + x 4. MS: no deformity or atrophy. Skin: warm and dry, no rash. Neuro:  Strength and sensation are intact. Psych: Alert, pleasant and communicative  Labs    Chemistry Recent Labs  Lab 09/29/17 2015 09/30/17 1050 10/01/17 0335  NA 141 140 142  K 4.3 4.3 4.0  CL 105 105 112*  CO2 26 26 25   GLUCOSE 187* 200* 167*  BUN 25* 20 19  CREATININE 1.00 1.08* 0.95  CALCIUM 9.1 8.7* 8.3*  PROT  --  6.2* 5.3*  ALBUMIN  --  3.7 3.0*  AST  --  19 15  ALT  --  13* 10*  ALKPHOS  --  62 49  BILITOT  --  0.8 0.3  GFRNONAA 53* 48* 56*  GFRAA >60 56* >60  ANIONGAP 10 9 5      Hematology Recent Labs  Lab 09/29/17 2015 09/30/17 1050 10/01/17 0335  WBC 6.2 5.1 4.8  RBC 4.12 4.20 3.83  HGB 13.3 13.4 12.1  HCT 38.3  39.1 35.5  MCV 92.9 93.0 92.9  MCH 32.3 32.0 31.5  MCHC 34.8 34.4 33.9  RDW 14.0 14.0 14.3  PLT 111* 112* 99*    Cardiac Enzymes Recent Labs  Lab 09/30/17 1050 09/30/17 1602 09/30/17 2129 10/01/17 0335  TROPONINI <0.03 <0.03 <0.03 <0.03     Radiology    No results found.  Telemetry    Afib - 60's to 70's - Personally Reviewed  Cardiac Studies   TTE 10/01/2017: Study Conclusions   - Left ventricle: The cavity size was normal. There was moderate   concentric hypertrophy. Systolic function was normal. The   estimated ejection fraction was in the range of 50% to 55%. Wall   motion was normal; there were no regional wall motion   abnormalities. - Aortic valve: There was mild regurgitation. - Mitral valve: There was mild regurgitation. - Left atrium: The atrium was moderately dilated. - Right atrium: The atrium was moderately dilated. - Tricuspid valve: There was moderate regurgitation. - Pulmonary arteries: Systolic pressure was mildly  increased. PA   peak pressure: 38 mm Hg (S). - Pericardium, extracardiac: A trivial pericardial effusion was   identified posterior to the heart.   Patient Profile     76 y.o. female with history of persistent Afib.flutter, frequent falls, possible dementia, and syncope who was admitted for syncope felt to be 2/2 orthostasis.   Assessment & Plan    1.  Syncope/Orthostatic Hypotension:   Mildly orthostatic, asymptomatic Systolic pressure 107 down to 92 with standing Recommend she increase her fluid intake She is taking midodrine 10 mg 3 times daily before meals  2.  Persistent Afib:   Timing of onset unclear, poor historian  on  blocker, dilt CHA2DS2VASc = 5, however, w/ h/o dementia and frequent falls, she is not likely to be a good candidate for OAC.  3. Dementia: stable.  Per psych/IM.  Signed, Julien Nordmannimothy Gollan, MD  10/05/2017, 1:00 PM    For questions or updates, please contact   Please consult [email or web address] for contact info under Cardiology/STEMI.

## 2017-10-05 NOTE — Progress Notes (Signed)
Patient discharge teaching given, including activity, diet, follow-up appoints, and medications. Patient verbalized understanding of all discharge instructions. IV access was d/c'd. Vitals are stable. Skin is intact except as charted in most recent assessments. Pt to be escorted out by NT, to be driven home by family.  Shimika Ames  

## 2017-10-05 NOTE — Discharge Summary (Signed)
[facility] [place] at [facility], 76 y.o., DOB 03-Dec-1940, MRN [id number]. Admission date: 09/30/2017 Discharge Date 10/05/2017 Primary MD Raynelle Bring Admitting Physician Ramonita Lab, MD  Admission Diagnosis  Syncope and collapse [R55] Back pain [M54.9] Syncope [R55] Abnormal EKG [R94.31]  Discharge Diagnosis   Principal Problem: Syncope due to orthostatic hypotension Chronic atrial fibrillation with rapid ventricular rate Diabetes type 2 Essential hypertension Depression with suicidal ideation Dementia with behavioral disturbances       Hospital Course Meghan Lloyd  is a 76 y.o. female with a known history of chronic atrial flutter ablation, diabetes mellitus, hypertension and 2 month history of syncope was admitted to the hospital in [facility] for syncope and car discharged to rehabilitation center. Recently released from the rehabilitation center and living with the daughter-in-law had a syncopal episode today. Patient was feeling dizzy and then about to fall to the ground but son caught her on time and patient did not sustain any injuries. CT head in the ED is negative. According to the EMS patient was significantly hypotensive when she stood up. Patient was also becoming tachycardic whenever she stands up and heart rate went up to 130s to 140.  Patient was admitted for further evaluation and therapy.  Her atrial fibrillation was controlled. Patient continues to have orthostatic hypotension therefore was started on midodrine.  Her orthostatic blood pressure is much improved she was able to ambulate. Patient also was seen by psychiatry due to expression of suicidal ideation during hospitalization however patient is doing much better and they do not feel that she needs psychiatric inpatient therapy.            Consults  psychiatry  Significant Tests:  See full reports for all details     Dg Thoracic Spine 2 View  Result Date:  09/30/2017 CLINICAL DATA:  Upper back pain. EXAM: THORACIC SPINE 2 VIEWS COMPARISON:  None. FINDINGS: There is no evidence of thoracic spine fracture. Alignment is normal. No other significant bone abnormalities are identified. IMPRESSION: Negative. Electronically Signed   By: Kennith Center M.D.   On: 09/30/2017 14:43   Ct Head Wo Contrast  Result Date: 09/30/2017 CLINICAL DATA:  Syncope with dizziness. EXAM: CT HEAD WITHOUT CONTRAST TECHNIQUE: Contiguous axial images were obtained from the base of the skull through the vertex without intravenous contrast. COMPARISON:  None. FINDINGS: Brain: There is no evidence for acute hemorrhage, hydrocephalus, mass lesion, or abnormal extra-axial fluid collection. No definite CT evidence for acute infarction. Diffuse loss of parenchymal volume is consistent with atrophy. Vascular: No hyperdense vessel or unexpected calcification. Skull: No evidence for fracture. No worrisome lytic or sclerotic lesion. Sinuses/Orbits: The visualized paranasal sinuses and mastoid air cells are clear. Visualized portions of the globes and intraorbital fat are unremarkable. Other: None. IMPRESSION: 1. No acute intracranial abnormality. Electronically Signed   By: Kennith Center M.D.   On: 09/30/2017 11:11   US Carotid Bilateral  Result Date: 10/01/2017 CLINICAL DATA:  Syncope EXAM: BILATERAL CAROTID DUPLEX ULTRASOUND TECHNIQUE: Wallace Cullens scale imaging, color Doppler and duplex ultrasound were performed of bilateral carotid and vertebral arteries in the neck. COMPARISON:  None. FINDINGS: Criteria: Quantification of carotid stenosis is based on velocity parameters that correlate the residual internal carotid diameter with NASCET-based stenosis levels, using the diameter of the distal internal carotid lumen as the denominator for stenosis measurement. The following velocity measurements were obtained: RIGHT ICA:  64 cm/sec CCA:  69 cm/sec SYSTOLIC ICA/CCA RATIO:  0.9 DIASTOLIC ICA/CCA  RATIO:  1.9  ECA:  78 cm/sec LEFT ICA:  87 cm/sec CCA:  49 cm/sec SYSTOLIC ICA/CCA RATIO:  1.8 DIASTOLIC ICA/CCA RATIO:  3.0 ECA:  99 cm/sec RIGHT CAROTID ARTERY: Mild irregular soft plaque in the bulb. There is mild calcified plaque along the wall of the lower internal carotid artery. Low resistance internal carotid Doppler pattern is preserved. RIGHT VERTEBRAL ARTERY:  Antegrade. LEFT CAROTID ARTERY: Little if any plaque in the bulb. There is mild calcified plaque in the lower internal carotid artery. There is moderate irregular calcified plaque in the mid internal carotid artery. Low resistance internal carotid Doppler pattern is preserved. LEFT VERTEBRAL ARTERY:  Antegrade. IMPRESSION: Less than 50% stenosis in the right and left internal carotid arteries. Electronically Signed   By: Jolaine ClickArthur  Hoss M.D.   On: 10/01/2017 11:09   Dg Chest Portable 1 View  Result Date: 09/30/2017 CLINICAL DATA:  Syncope EXAM: PORTABLE CHEST 1 VIEW COMPARISON:  None. FINDINGS: Heart is borderline in size. Lungs are clear. No effusions or acute bony abnormality. IMPRESSION: Mild cardiomegaly.  No active disease. Electronically Signed   By: Charlett NoseKevin  Dover M.D.   On: 09/30/2017 11:28       Today   Subjective:   Meghan Lloyd patient is feeling better denies any complaints Objective:   Blood pressure (!) 127/50, pulse 90, temperature 98 F (36.7 C), temperature source Oral, resp. rate 16, height 5\' 7"  (1.702 m), weight 165 lb 4.8 oz (75 kg), SpO2 100 %.  .  Intake/Output Summary (Last 24 hours) at 10/05/2017 1627 Last data filed at 10/05/2017 1454 Gross per 24 hour  Intake 360 ml  Output 1150 ml  Net -790 ml    Exam VITAL SIGNS: Blood pressure (!) 127/50, pulse 90, temperature 98 F (36.7 C), temperature source Oral, resp. rate 16, height 5\' 7"  (1.702 m), weight 165 lb 4.8 oz (75 kg), SpO2 100 %.  GENERAL:  76 y.o.-year-old patient lying in the bed with no acute distress.  EYES: Pupils equal, round, reactive to light and  accommodation. No scleral icterus. Extraocular muscles intact.  HEENT: Head atraumatic, normocephalic. Oropharynx and nasopharynx clear.  NECK:  Supple, no jugular venous distention. No thyroid enlargement, no tenderness.  LUNGS: Normal breath sounds bilaterally, no wheezing, rales,rhonchi or crepitation. No use of accessory muscles of respiration.  CARDIOVASCULAR: S1, S2 normal. No murmurs, rubs, or gallops.  ABDOMEN: Soft, nontender, nondistended. Bowel sounds present. No organomegaly or mass.  EXTREMITIES: No pedal edema, cyanosis, or clubbing.  NEUROLOGIC: Cranial nerves II through XII are intact. Muscle strength 5/5 in all extremities. Sensation intact. Gait not checked.  PSYCHIATRIC: The patient is alert and oriented x 3.  SKIN: No obvious rash, lesion, or ulcer.   Data Review     CBC w Diff:  Lab Results  Component Value Date   WBC 4.8 10/01/2017   HGB 12.1 10/01/2017   HCT 35.5 10/01/2017   PLT 99 (L) 10/01/2017   LYMPHOPCT 27 09/29/2017   MONOPCT 6 09/29/2017   EOSPCT 1 09/29/2017   BASOPCT 0 09/29/2017   CMP:  Lab Results  Component Value Date   NA 142 10/01/2017   K 4.0 10/01/2017   CL 112 (H) 10/01/2017   CO2 25 10/01/2017   BUN 19 10/01/2017   CREATININE 0.95 10/01/2017   PROT 5.3 (L) 10/01/2017   ALBUMIN 3.0 (L) 10/01/2017   BILITOT 0.3 10/01/2017   ALKPHOS 49 10/01/2017   AST 15 10/01/2017   ALT 10 (L) 10/01/2017  .  Micro Results No results found for this or any previous visit (from the past 240 hour(s)).      Code Status Orders  (From admission, onward)        Start     Ordered   09/30/17 1540  Full code  Continuous     09/30/17 1539    Code Status History    Date Active Date Inactive Code Status Order ID Comments User Context   This patient has a current code status but no historical code status.    Advance Directive Documentation     Most Recent Value  Type of Advance Directive  Healthcare Power of Attorney  Pre-existing out of  facility DNR order (yellow form or pink MOST form)  No data  "MOST" Form in Place?  No data          Follow-up Information    [facility] Follow up in 1 week(s).   Contact information: [address] [place] [id number] [phone]        Antonieta IbaGollan, Timothy J, MD .   Specialty:  Cardiology Contact information: [address] [place] [address] [phone]           Discharge Medications   Allergies as of 10/05/2017   No Known Allergies     Medication List    TAKE these medications   clopidogrel 75 MG tablet Commonly known as:  PLAVIX Take 75 mg by mouth daily.   diltiazem 180 MG 24 hr capsule Commonly known as:  DILACOR XR Take 180 mg by mouth daily.   metoprolol tartrate 25 MG tablet Commonly known as:  LOPRESSOR Take 1 tablet (25 mg total) by mouth 2 (two) times daily.   midodrine 10 MG tablet Commonly known as:  PROAMATINE Take 1 tablet (10 mg total) by mouth 3 (three) times daily with meals.   OLANZapine 2.5 MG tablet Commonly known as:  ZYPREXA Take 2.5 mg by mouth at bedtime.   potassium chloride SA 20 MEQ tablet Commonly known as:  K-DUR,KLOR-CON Take 20 mEq by mouth daily.   pravastatin 80 MG tablet Commonly known as:  PRAVACHOL Take 80 mg by mouth daily.   QUEtiapine 25 MG tablet Commonly known as:  SEROQUEL Take 0.5 tablets (12.5 mg total) by mouth every 6 (six) hours as needed (anxiety).          Total Time in preparing paper work, data evaluation and todays exam - 35 minutes  Auburn BilberryPATEL, Keian Odriscoll M.D on 10/05/2017 at 4:27 PM  [facility]   Office  [phone]

## 2017-10-05 NOTE — Discharge Instructions (Signed)
[facility] - [place] at [facility] ° °DIET:  °Cardiac diet ° °DISCHARGE CONDITION:  °Stable ° °ACTIVITY:  °Activity as tolerated ° °OXYGEN:  °Home Oxygen: No. °  °Oxygen Delivery: room air ° °DISCHARGE LOCATION:  °home  ° ° °ADDITIONAL DISCHARGE INSTRUCTION: ° ° °If you experience worsening of your admission symptoms, develop shortness of breath, life threatening emergency, suicidal or homicidal thoughts you must seek medical attention immediately by calling 911 or calling your MD immediately  if symptoms less severe. ° °You Must read complete instructions/literature along with all the possible adverse reactions/side effects for all the Medicines you take and that have been prescribed to you. Take any new Medicines after you have completely understood and accpet all the possible adverse reactions/side effects.  ° °Please note ° °You were cared for by a hospitalist during your hospital stay. If you have any questions about your discharge medications or the care you received while you were in the hospital after you are discharged, you can call the unit and asked to speak with the hospitalist on call if the hospitalist that took care of you is not available. Once you are discharged, your primary care physician will handle any further medical issues. Please note that NO REFILLS for any discharge medications will be authorized once you are discharged, as it is imperative that you return to your primary care physician (or establish a relationship with a primary care physician if you do not have one) for your aftercare needs so that they can reassess your need for medications and monitor your lab values. ° ° °

## 2017-10-05 NOTE — Care Management Important Message (Signed)
Important Message  Patient Details  Name: Meghan Lloyd MRN: [id number] Date of Birth: 04/19/1941   Medicare Important Message Given:  Yes    [name], [name], RN 10/05/2017, 10:51 AM

## 2017-10-05 NOTE — Progress Notes (Signed)
Patient refusing to stand for orthostatic vitals. Patient covers self with blankets and moves arm away when nursing staff attempts to wake patient. Will continue to monitor patient.

## 2017-10-05 NOTE — Care Management (Signed)
Patient to discharge home with son and daughter in law today.  Home health orders have been written for PT, and aide.  [facility] notified of discharge. RNCM signing off.

## 2017-10-05 NOTE — Progress Notes (Signed)
Physical Therapy Treatment Patient Details Name: Meghan Lloyd MRN: [id number] DOB: 03-Feb-1941 Today's Date: 10/05/2017    History of Present Illness Pt is a76 y.o.femalewith a known history of chronic atrial flutter ablation, diabetes mellitus, hypertension and 2 month history of syncope [facility] and was discharged to rehabilitation center.Recently released from the rehabilitation center and living with the daughter-in-law and had a syncopal episode. Patient was feeling dizzy and then about to fall to the ground but son caught her on time and patient did not sustain any injuries. CT head in the ED is negative. According to the EMS patient was significantly hypotensive when she stood up. Patient was also becoming tachycardic whenever she stands up and heart rate went up to 130s to 140 according to the EMS.Patient denies any nausea vomiting or diarrhea. Denies any chest pain.  Assessment includes: Syncope, confusion, A-fib/flutter with RVR, and orthostasis.     PT Comments    Pt is able to ambulate w/o AD well today and showed no LOBs or significant safety issues.  She had some difficulty with higher level balance activities and did need varying degrees of HHA.    Follow Up Recommendations  Home health PT     Equipment Recommendations  None recommended by PT    Recommendations for Other Services       Precautions / Restrictions Precautions Precautions: Fall Restrictions Weight Bearing Restrictions: No    Mobility  Bed Mobility               General bed mobility comments: in recliner, not performed  Transfers Overall transfer level: Modified independent Equipment used: None Transfers: Sit to/from Stand Sit to Stand: Modified independent (Device/Increase time)         General transfer comment: Pt is able to rise w/o assist, maintained balance well w/o AD  Ambulation/Gait Ambulation/Gait assistance: Supervision Ambulation  Distance (Feet): 200 Feet Assistive device: None       General Gait Details: Pt minimally slower than yesterday (w/ walker) but overall showed good balance and confidencewithout needing UEs   Stairs Stairs: Yes   Stair Management: One rail Left Number of Stairs: 6 General stair comments: Pt easily negotiates up/down steps with UE use on rails  Wheelchair Mobility    Modified Rankin (Stroke Patients Only)       Balance Overall balance assessment: Modified Independent   Sitting balance-Leahy Scale: Normal       Standing balance-Leahy Scale: Good                              Cognition Arousal/Alertness: Awake/alert Behavior During Therapy: Impulsive Overall Cognitive Status: History of cognitive impairments - at baseline                                 General Comments: pleasantly confused and rambling      Exercises Other Exercises Other Exercises: balance exercises including multiple reps of: toe raises, SLS, eyes closed w/ external perturbations, heel-toe ambulation (all with faded HHA)    General Comments        Pertinent Vitals/Pain Pain Assessment: No/denies pain    Home Living                      Prior Function            PT Goals (  current goals can now be found in the care plan section) Progress towards PT goals: Progressing toward goals    Frequency    Min 2X/week      PT Plan Current plan remains appropriate    Co-evaluation              AM-PAC PT "6 Clicks" Daily Activity  Outcome Measure  Difficulty turning over in bed (including adjusting bedclothes, sheets and blankets)?: None Difficulty moving from lying on back to sitting on the side of the bed? : None Difficulty sitting down on and standing up from a chair with arms (e.g., wheelchair, bedside commode, etc,.)?: None Help needed moving to and from a bed to chair (including a wheelchair)?: None Help needed walking in hospital room?:  None Help needed climbing 3-5 steps with a railing? : None 6 Click Score: 24    End of Session Equipment Utilized During Treatment: Gait belt Activity Tolerance: Patient tolerated treatment well Patient left: in chair;with nursing/sitter in room   PT Visit Diagnosis: Unsteadiness on feet (R26.81);Difficulty in walking, not elsewhere classified (R26.2)     Time: [id number] PT Time Calculation (min) (ACUTE ONLY): 25 min  Charges:  $Gait Training: 8-22 mins $Therapeutic Exercise: 8-22 mins                    G Codes:       Meghan Lloyd, DPT 10/05/2017, 5:30 PM

## 2017-10-10 ENCOUNTER — Encounter: Payer: Self-pay | Admitting: Emergency Medicine

## 2017-10-10 ENCOUNTER — Emergency Department: Payer: Medicare Other

## 2017-10-10 ENCOUNTER — Emergency Department
Admission: EM | Admit: 2017-10-10 | Discharge: 2017-10-10 | Disposition: A | Discharge status: Home / Self Care | Payer: Medicare Other | Source: Home / Self Care | Attending: Emergency Medicine | Admitting: Emergency Medicine

## 2017-10-10 ENCOUNTER — Observation Stay
Admission: EM | Admit: 2017-10-10 | Discharge: 2017-10-12 | Disposition: A | Discharge status: Home / Self Care | Payer: Medicare Other | Attending: Internal Medicine | Admitting: Internal Medicine

## 2017-10-10 ENCOUNTER — Other Ambulatory Visit: Payer: Self-pay

## 2017-10-10 DIAGNOSIS — Z9119 Patient's noncompliance with other medical treatment and regimen: Secondary | ICD-10-CM | POA: Diagnosis not present

## 2017-10-10 DIAGNOSIS — R2681 Unsteadiness on feet: Secondary | ICD-10-CM | POA: Insufficient documentation

## 2017-10-10 DIAGNOSIS — S0990XA Unspecified injury of head, initial encounter: Secondary | ICD-10-CM

## 2017-10-10 DIAGNOSIS — F17200 Nicotine dependence, unspecified, uncomplicated: Secondary | ICD-10-CM | POA: Insufficient documentation

## 2017-10-10 DIAGNOSIS — Z7902 Long term (current) use of antithrombotics/antiplatelets: Secondary | ICD-10-CM | POA: Insufficient documentation

## 2017-10-10 DIAGNOSIS — Y929 Unspecified place or not applicable: Secondary | ICD-10-CM | POA: Insufficient documentation

## 2017-10-10 DIAGNOSIS — R55 Syncope and collapse: Secondary | ICD-10-CM | POA: Diagnosis present

## 2017-10-10 DIAGNOSIS — S0003XA Contusion of scalp, initial encounter: Secondary | ICD-10-CM | POA: Diagnosis not present

## 2017-10-10 DIAGNOSIS — W0110XA Fall on same level from slipping, tripping and stumbling with subsequent striking against unspecified object, initial encounter: Secondary | ICD-10-CM | POA: Insufficient documentation

## 2017-10-10 DIAGNOSIS — Y998 Other external cause status: Secondary | ICD-10-CM

## 2017-10-10 DIAGNOSIS — Z79899 Other long term (current) drug therapy: Secondary | ICD-10-CM | POA: Insufficient documentation

## 2017-10-10 DIAGNOSIS — F0391 Unspecified dementia with behavioral disturbance: Principal | ICD-10-CM | POA: Insufficient documentation

## 2017-10-10 DIAGNOSIS — I4891 Unspecified atrial fibrillation: Secondary | ICD-10-CM

## 2017-10-10 DIAGNOSIS — W010XXA Fall on same level from slipping, tripping and stumbling without subsequent striking against object, initial encounter: Secondary | ICD-10-CM

## 2017-10-10 DIAGNOSIS — Y9389 Activity, other specified: Secondary | ICD-10-CM | POA: Insufficient documentation

## 2017-10-10 DIAGNOSIS — J320 Chronic maxillary sinusitis: Secondary | ICD-10-CM | POA: Insufficient documentation

## 2017-10-10 DIAGNOSIS — I951 Orthostatic hypotension: Secondary | ICD-10-CM | POA: Insufficient documentation

## 2017-10-10 DIAGNOSIS — I10 Essential (primary) hypertension: Secondary | ICD-10-CM | POA: Insufficient documentation

## 2017-10-10 DIAGNOSIS — E119 Type 2 diabetes mellitus without complications: Secondary | ICD-10-CM | POA: Insufficient documentation

## 2017-10-10 DIAGNOSIS — R52 Pain, unspecified: Secondary | ICD-10-CM

## 2017-10-10 DIAGNOSIS — I481 Persistent atrial fibrillation: Secondary | ICD-10-CM | POA: Diagnosis not present

## 2017-10-10 DIAGNOSIS — F03918 Unspecified dementia, unspecified severity, with other behavioral disturbance: Secondary | ICD-10-CM | POA: Diagnosis present

## 2017-10-10 HISTORY — DX: Unspecified dementia, unspecified severity, without behavioral disturbance, psychotic disturbance, mood disturbance, and anxiety: F03.90

## 2017-10-10 HISTORY — DX: Orthostatic hypotension: I95.1

## 2017-10-10 HISTORY — DX: Other persistent atrial fibrillation: I48.19

## 2017-10-10 HISTORY — DX: Syncope and collapse: R55

## 2017-10-10 LAB — CBC WITH DIFFERENTIAL/PLATELET
Basophils Absolute: 0 10*3/uL (ref 0–0.1)
Basophils Relative: 0 %
EOS ABS: 0 10*3/uL (ref 0–0.7)
Eosinophils Relative: 0 %
HEMATOCRIT: 38.6 % (ref 35.0–47.0)
HEMOGLOBIN: 13.3 g/dL (ref 12.0–16.0)
LYMPHS ABS: 1.4 10*3/uL (ref 1.0–3.6)
LYMPHS PCT: 19 %
MCH: 31.8 pg (ref 26.0–34.0)
MCHC: 34.3 g/dL (ref 32.0–36.0)
MCV: 92.5 fL (ref 80.0–100.0)
MONOS PCT: 6 %
Monocytes Absolute: 0.4 10*3/uL (ref 0.2–0.9)
NEUTROS PCT: 75 %
Neutro Abs: 5.6 10*3/uL (ref 1.4–6.5)
Platelets: 116 10*3/uL — ABNORMAL LOW (ref 150–440)
RBC: 4.18 MIL/uL (ref 3.80–5.20)
RDW: 14.3 % (ref 11.5–14.5)
WBC: 7.5 10*3/uL (ref 3.6–11.0)

## 2017-10-10 LAB — BASIC METABOLIC PANEL
ANION GAP: 10 (ref 5–15)
BUN: 30 mg/dL — ABNORMAL HIGH (ref 6–20)
CALCIUM: 9.1 mg/dL (ref 8.9–10.3)
CO2: 24 mmol/L (ref 22–32)
Chloride: 104 mmol/L (ref 101–111)
Creatinine, Ser: 1.08 mg/dL — ABNORMAL HIGH (ref 0.44–1.00)
GFR calc Af Amer: 56 mL/min — ABNORMAL LOW (ref >60)
GFR calc non Af Amer: 48 mL/min — ABNORMAL LOW (ref >60)
GLUCOSE: 261 mg/dL — AB (ref 65–99)
Potassium: 4.4 mmol/L (ref 3.5–5.1)
Sodium: 138 mmol/L (ref 135–145)

## 2017-10-10 LAB — COMPREHENSIVE METABOLIC PANEL
ALK PHOS: 60 U/L (ref 38–126)
ALT: 21 U/L (ref 14–54)
ANION GAP: 7 (ref 5–15)
AST: 21 U/L (ref 15–41)
Albumin: 3.9 g/dL (ref 3.5–5.0)
BUN: 29 mg/dL — ABNORMAL HIGH (ref 6–20)
CALCIUM: 8.9 mg/dL (ref 8.9–10.3)
CHLORIDE: 103 mmol/L (ref 101–111)
CO2: 26 mmol/L (ref 22–32)
Creatinine, Ser: 1.17 mg/dL — ABNORMAL HIGH (ref 0.44–1.00)
GFR, EST AFRICAN AMERICAN: 51 mL/min — AB (ref >60)
GFR, EST NON AFRICAN AMERICAN: 44 mL/min — AB (ref >60)
Glucose, Bld: 251 mg/dL — ABNORMAL HIGH (ref 65–99)
Potassium: 4.4 mmol/L (ref 3.5–5.1)
SODIUM: 136 mmol/L (ref 135–145)
Total Bilirubin: 1 mg/dL (ref 0.3–1.2)
Total Protein: 6.7 g/dL (ref 6.5–8.1)

## 2017-10-10 LAB — CBC
HCT: 40.2 % (ref 35.0–47.0)
Hemoglobin: 13.7 g/dL (ref 12.0–16.0)
MCH: 32 pg (ref 26.0–34.0)
MCHC: 34.1 g/dL (ref 32.0–36.0)
MCV: 93.8 fL (ref 80.0–100.0)
PLATELETS: 98 10*3/uL — AB (ref 150–440)
RBC: 4.28 MIL/uL (ref 3.80–5.20)
RDW: 14.4 % (ref 11.5–14.5)
WBC: 6.7 10*3/uL (ref 3.6–11.0)

## 2017-10-10 LAB — TROPONIN I: Troponin I: <0.03 ng/mL (ref <0.03)

## 2017-10-10 MED ORDER — METOPROLOL TARTRATE 25 MG PO TABS
25.0000 mg | ORAL_TABLET | Freq: Once | ORAL | Status: AC
  Administered 2017-10-10: 25 mg via ORAL
  Filled 2017-10-10: qty 1

## 2017-10-10 NOTE — H&P (Signed)
[facility] [place] [facility]   PATIENT NAME: Meghan KettleDorothy Lloyd    MR#:  [id number]  DATE OF BIRTH:  01-26-1941  DATE OF ADMISSION:  10/10/2017  PRIMARY CARE PHYSICIAN: [facility]   REQUESTING/REFERRING PHYSICIAN: Shaune PollackLord, MD  CHIEF COMPLAINT:   Chief Complaint  Patient presents with  . Fall  . Dizziness    HISTORY OF PRESENT ILLNESS:  Meghan Lloyd  is a 76 y.o. female who presents with syncopal episodes at home.  Patient presented here to the ED in A. fib with RVR.  Patient has had these episodes, and has been evaluated and is on midodrine given her orthostatic hypotension.  These episodes make it somewhat difficult to take care of her at home, and given her A. fib with RVR hospitalist were called for admission and further evaluation  PAST MEDICAL HISTORY:   Past Medical History:  Diagnosis Date  . Atrial fibrillation (HCC)   . Dementia   . Diabetes mellitus without complication (HCC)   . Hypertension     PAST SURGICAL HISTORY:  History reviewed. No pertinent surgical history.  SOCIAL HISTORY:   Social History   Tobacco Use  . Smoking status: Current Every Day Smoker  . Smokeless tobacco: Never Used  Substance Use Topics  . Alcohol use: Yes    FAMILY HISTORY:   Family History  Family history unknown: Yes    DRUG ALLERGIES:  No Known Allergies  MEDICATIONS AT HOME:   Prior to Admission medications   Medication Sig Start Date End Date Taking? Authorizing Provider  clopidogrel (PLAVIX) 75 MG tablet Take 75 mg by mouth daily.   Yes [provider]  metoprolol tartrate (LOPRESSOR) 25 MG tablet Take 1 tablet (25 mg total) by mouth 2 (two) times daily. 10/05/17  Yes Auburn BilberryPatel, Shreyang, MD  midodrine (PROAMATINE) 10 MG tablet Take 1 tablet (10 mg total) by mouth 3 (three) times daily with meals. 10/05/17  Yes Auburn BilberryPatel, Shreyang, MD  pravastatin (PRAVACHOL) 80 MG tablet Take 80 mg by mouth daily.   Yes [provider]  QUEtiapine (SEROQUEL) 25 MG tablet Take 0.5 tablets (12.5 mg total) by mouth every 6 (six) hours as needed (anxiety). 10/05/17  Yes Auburn BilberryPatel, Shreyang, MD  potassium chloride SA (K-DUR,KLOR-CON) 20 MEQ tablet Take 20 mEq by mouth daily.    [provider]    REVIEW OF SYSTEMS:  Review of Systems  Unable to perform ROS: Dementia     VITAL SIGNS:   Vitals:   10/10/17 1800 10/10/17 1930 10/10/17 2052 10/10/17 2107  BP: (!) 122/92 (!) 111/95 124/68 116/78  Pulse: 87   (!) 131  Resp: 11 17 17    Temp:      TempSrc:      SpO2: 97%     Weight:      Height:       Wt Readings from Last 3 Encounters:  10/10/17 74.8 kg (165 lb)  10/10/17 74.8 kg (165 lb)  10/05/17 75 kg (165 lb 4.8 oz)    PHYSICAL EXAMINATION:  Physical Exam  Vitals reviewed. Constitutional: She is oriented to person, place, and time. She appears well-developed and well-nourished. No distress.  HENT:  Head: Normocephalic and atraumatic.  Mouth/Throat: Oropharynx is clear and moist.  Eyes: Conjunctivae and EOM are normal. Pupils are equal, round, and reactive to light. No scleral icterus.  Neck: Normal range of motion. Neck supple. No JVD present. No thyromegaly present.  Cardiovascular: Normal rate, regular rhythm and intact distal  pulses. Exam reveals no gallop and no friction rub.  No murmur heard. Respiratory: Effort normal and breath sounds normal. No respiratory distress. She has no wheezes. She has no rales.  GI: Soft. Bowel sounds are normal. She exhibits no distension. There is no tenderness.  Musculoskeletal: Normal range of motion. She exhibits no edema.  No arthritis, no gout  Lymphadenopathy:    She has no cervical adenopathy.  Neurological: She is alert and oriented to person, place, and time. No cranial nerve deficit.  Patient is oriented to person and place, though orientation to circumstances questionable she states she does not remember her syncopal episodes and has only been told that  they are happening, and she provides extensive extraneous information which is difficult to verify  Skin: Skin is warm and dry. No rash noted. No erythema.  Psychiatric:  Unable to fully assess due to patient dementia    LABORATORY PANEL:   CBC Recent Labs  Lab 10/10/17 2100  WBC 7.5  HGB 13.3  HCT 38.6  PLT 116*   ------------------------------------------------------------------------------------------------------------------  Chemistries  Recent Labs  Lab 10/10/17 1150 10/10/17 2100  NA 136 138  K 4.4 4.4  CL 103 104  CO2 26 24  GLUCOSE 251* 261*  BUN 29* 30*  CREATININE 1.17* 1.08*  CALCIUM 8.9 9.1  AST 21  --   ALT 21  --   ALKPHOS 60  --   BILITOT 1.0  --    ------------------------------------------------------------------------------------------------------------------  Cardiac Enzymes Recent Labs  Lab 10/10/17 2100  TROPONINI <0.03   ------------------------------------------------------------------------------------------------------------------  RADIOLOGY:  Dg Shoulder Right  Result Date: 10/10/2017 CLINICAL DATA:  Right shoulder pain after multiple falls. EXAM: RIGHT SHOULDER - 2+ VIEW COMPARISON:  None. FINDINGS: There is no evidence of fracture or dislocation. 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 Soft tissues calcifications adjacent to the superior portion of the glenoid likely represent calcific tendinitis. IMPRESSION: No acute fracture or dislocation identified about the right shoulder. Electronically Signed   By: Ted Mcalpineobrinka  Dimitrova M.D.   On: 10/10/2017 18:53   Ct Head Wo Contrast  Result Date: 10/10/2017 CLINICAL DATA:  Status post 2 falls today. EXAM: CT HEAD WITHOUT CONTRAST CT CERVICAL SPINE WITHOUT CONTRAST TECHNIQUE: Multidetector CT imaging of the head and cervical spine was performed following the standard protocol without intravenous contrast. Multiplanar CT image reconstructions of the cervical spine were also  generated. COMPARISON:  10/10/2017 FINDINGS: CT HEAD FINDINGS Brain: No evidence of acute infarction, hemorrhage, hydrocephalus, extra-axial collection or mass lesion/mass effect. Mild brain parenchymal volume loss and periventricular microangiopathy. Vascular: Calcific atherosclerotic disease of intra cavernous internal carotid arteries. Skull: Normal. Negative for fracture or focal lesion. Sinuses/Orbits: Dense opacification of the left maxillary sinus with high density material with periosteal thickening in the sinus walls, evidence of chronic sinusitis. Other: Left parietal scalp hematoma. CT CERVICAL SPINE FINDINGS Alignment: Normal. Skull base and vertebrae: No acute fracture. No primary bone lesion or focal pathologic process. Soft tissues and spinal canal: No prevertebral fluid or swelling. No visible canal hematoma. Disc levels: Multilevel osteoarthritic changes, with prominent posterior facet arthropathy. Upper chest: Right apical subpleural scarring. 7 mm nodular area in the right apex may represent scarring versus a pulmonary nodule, image 83/95, sequence 6. Other: None. IMPRESSION: No acute intracranial abnormality. No evidence of acute traumatic injury to the cervical spine. Chronic left maxillary sinusitis. 7 mm pulmonary nodule versus pleuroparenchymal scarring in the right lung apex. Non-contrast chest CT at 6-12 months is recommended. If the nodule is  stable at time of repeat CT, then future CT at 18-24 months (from today's scan) is considered optional for low-risk patients, but is recommended for high-risk patients. This recommendation follows the consensus statement: Guidelines for Management of Incidental Pulmonary Nodules Detected on CT Images: From the Fleischner Society 2017; Radiology 2017; 284:228-243. Electronically Signed   By: Ted Mcalpine M.D.   On: 10/10/2017 19:04   Ct Head Wo Contrast  Result Date: 10/10/2017 CLINICAL DATA:  Dizziness.  Possible head injury. EXAM: CT HEAD  WITHOUT CONTRAST TECHNIQUE: Contiguous axial images were obtained from the base of the skull through the vertex without intravenous contrast. COMPARISON:  CT scan of September 30, 2017. FINDINGS: Brain: Mild diffuse cortical atrophy is noted. Mild chronic ischemic white matter disease is noted. No mass effect or midline shift is noted. Ventricular size is within normal limits. There is no evidence of mass lesion, hemorrhage or acute infarction. Vascular: No hyperdense vessel or unexpected calcification. Skull: Normal. Negative for fracture or focal lesion. Sinuses/Orbits: No acute finding. Other: Small left parietal scalp hematoma is noted. IMPRESSION: Small left parietal scalp hematoma. Mild diffuse cortical atrophy. Mild chronic ischemic white matter disease. No acute intracranial abnormality seen. Electronically Signed   By: Lupita Raider, M.D.   On: 10/10/2017 12:32   Ct Cervical Spine Wo Contrast  Result Date: 10/10/2017 CLINICAL DATA:  Status post 2 falls today. EXAM: CT HEAD WITHOUT CONTRAST CT CERVICAL SPINE WITHOUT CONTRAST TECHNIQUE: Multidetector CT imaging of the head and cervical spine was performed following the standard protocol without intravenous contrast. Multiplanar CT image reconstructions of the cervical spine were also generated. COMPARISON:  10/10/2017 FINDINGS: CT HEAD FINDINGS Brain: No evidence of acute infarction, hemorrhage, hydrocephalus, extra-axial collection or mass lesion/mass effect. Mild brain parenchymal volume loss and periventricular microangiopathy. Vascular: Calcific atherosclerotic disease of intra cavernous internal carotid arteries. Skull: Normal. Negative for fracture or focal lesion. Sinuses/Orbits: Dense opacification of the left maxillary sinus with high density material with periosteal thickening in the sinus walls, evidence of chronic sinusitis. Other: Left parietal scalp hematoma. CT CERVICAL SPINE FINDINGS Alignment: Normal. Skull base and vertebrae: No acute  fracture. No primary bone lesion or focal pathologic process. Soft tissues and spinal canal: No prevertebral fluid or swelling. No visible canal hematoma. Disc levels: Multilevel osteoarthritic changes, with prominent posterior facet arthropathy. Upper chest: Right apical subpleural scarring. 7 mm nodular area in the right apex may represent scarring versus a pulmonary nodule, image 83/95, sequence 6. Other: None. IMPRESSION: No acute intracranial abnormality. No evidence of acute traumatic injury to the cervical spine. Chronic left maxillary sinusitis. 7 mm pulmonary nodule versus pleuroparenchymal scarring in the right lung apex. Non-contrast chest CT at 6-12 months is recommended. If the nodule is stable at time of repeat CT, then future CT at 18-24 months (from today's scan) is considered optional for low-risk patients, but is recommended for high-risk patients. This recommendation follows the consensus statement: Guidelines for Management of Incidental Pulmonary Nodules Detected on CT Images: From the Fleischner Society 2017; Radiology 2017; 284:228-243. Electronically Signed   By: Ted Mcalpine M.D.   On: 10/10/2017 19:04    EKG:   Orders placed or performed during the hospital encounter of 10/10/17  . EKG 12-Lead  . EKG 12-Lead    IMPRESSION AND PLAN:  Principal Problem:   Syncope -likely related to her A. fib with RVR and also her static hypotension.  She has been evaluated for this in the past.  We will consult cardiology  to see her again for any possible new recommendations. Active Problems:   Atrial fibrillation with RVR (HCC) -home dose rate controlling medications, additional as needed rate controlling meds if needed to keep her heart rate less than 110   HTN (hypertension) -continue home meds   Diabetes (HCC) -sliding scale insulin with corresponding glucose checks   Dementia with behavioral disturbance -continue home meds  All the records are reviewed and case discussed with  ED provider. Management plans discussed with the patient and/or family.  DVT PROPHYLAXIS: SubQ lovenox  GI PROPHYLAXIS: None  ADMISSION STATUS: Observation  CODE STATUS: Full Code Status History    Date Active Date Inactive Code Status Order ID Comments User Context   09/30/2017 15:39 10/05/2017 21:06 Full Code [id number]  Ramonita Lab, MD Inpatient    Advance Directive Documentation     Most Recent Value  Type of Advance Directive  Healthcare Power of Attorney  Pre-existing out of facility DNR order (yellow form or pink MOST form)  No data  "MOST" Form in Place?  No data      TOTAL TIME TAKING CARE OF THIS PATIENT: 40 minutes.   Hafsah Hendler FIELDING 10/10/2017, 10:16 PM  [employer]  [phone]  CC: Primary care physician; Raynelle Bring  Note:  This document was prepared using Dragon voice recognition software and may include unintentional dictation errors.

## 2017-10-10 NOTE — Progress Notes (Signed)
EN RN Lurena JoinerRebecca is checking with Dr. Anne HahnWillis about STAT Urinalysis and abnormal BUN/Cr

## 2017-10-10 NOTE — ED Notes (Signed)
Patient transported to CT 

## 2017-10-10 NOTE — ED Provider Notes (Addendum)
[facility] Emergency Department Provider Note ____________________________________________   I have reviewed the triage vital signs and the triage nursing note.  HISTORY  Chief Complaint Fall and Dizziness   Historian Patient and daughter  HPI Meghan Lloyd is a 76 y.o. female presents from home where she is staying with her daughter.  Patient has had quite trouble over the past several weeks with recurrent syncope.  It sounds like she has been somewhat debilitated over the past several months and has completed her allotted insurance days for nursing home placement and is currently living with her daughter.  She has had recurrent episodes of syncope which it sounds like are felt to be due to orthostatic hypotension as well as atrial fibrillation.  She was admitted to the hospital a few weeks ago and started on Midrin.  She is continued to have problems with syncope at home.  She does try to get up slowly and sit down when she is feeling dizzy.  She was seen this morning after syncopal episode without traumatic injury and was discharged home.  Within an hour of them getting home she had another syncopal event.  She gets dizzy and then passes out and then comes right back to.  From a traumatic standpoint she is complaining of striking her head, as well as neck soreness and right shoulder soreness.    Past Medical History:  Diagnosis Date  . Atrial fibrillation (HCC)   . Dementia   . Diabetes mellitus without complication (HCC)   . Hypertension     Patient Active Problem List   Diagnosis Date Noted  . Syncope due to orthostatic hypotension 10/04/2017  . Dementia with behavioral disturbance 10/01/2017  . Syncope 09/30/2017    History reviewed. No pertinent surgical history.  Prior to Admission medications   Medication Sig Start Date End Date Taking? Authorizing Provider  clopidogrel (PLAVIX) 75 MG tablet Take 75 mg by mouth daily.    [provider]  diltiazem (DILACOR XR) 180 MG 24 hr capsule Take 180 mg by mouth daily.    [provider]  metoprolol tartrate (LOPRESSOR) 25 MG tablet Take 1 tablet (25 mg total) by mouth 2 (two) times daily. 10/05/17   Auburn BilberryPatel, Shreyang, MD  midodrine (PROAMATINE) 10 MG tablet Take 1 tablet (10 mg total) by mouth 3 (three) times daily with meals. 10/05/17   Auburn BilberryPatel, Shreyang, MD  OLANZapine (ZYPREXA) 2.5 MG tablet Take 2.5 mg by mouth at bedtime.    [provider]  potassium chloride SA (K-DUR,KLOR-CON) 20 MEQ tablet Take 20 mEq by mouth daily.    [provider]  pravastatin (PRAVACHOL) 80 MG tablet Take 80 mg by mouth daily.    [provider]  QUEtiapine (SEROQUEL) 25 MG tablet Take 0.5 tablets (12.5 mg total) by mouth every 6 (six) hours as needed (anxiety). 10/05/17   Auburn BilberryPatel, Shreyang, MD    No Known Allergies  History reviewed. No pertinent family history.  Social History Social History   Tobacco Use  . Smoking status: Current Every Day Smoker  . Smokeless tobacco: Never Used  Substance Use Topics  . Alcohol use: Yes  . Drug use: Not on file    Review of Systems  Constitutional: Negative for fever. Eyes: Negative for visual changes. ENT: Negative for sore throat. Cardiovascular: Negative for chest pain. Respiratory: Negative for shortness of breath. Gastrointestinal: Negative for abdominal pain, vomiting and diarrhea. Genitourinary: Negative for dysuria. Musculoskeletal: Negative for back pain. Skin: Negative for rash.  Neurological: Negative for headache.  ____________________________________________   PHYSICAL EXAM:  VITAL SIGNS: ED Triage Vitals  Enc Vitals Group     BP 10/10/17 1602 91/61     Pulse Rate 10/10/17 1602 75     Resp 10/10/17 1602 16     Temp 10/10/17 1602 97.6 F (36.4 C)     Temp Source 10/10/17 1602 Oral     SpO2 10/10/17 1602 99 %     Weight 10/10/17 1603 165 lb (74.8 kg)     Height 10/10/17 1603 5\' 7"   (1.702 m)     Head Circumference --      Peak Flow --      Pain Score 10/10/17 1601 7     Pain Loc --      Pain Edu? --      Excl. in GC? --      Constitutional: Alert and oriented. Well appearing and in no distress. HEENT   Head: Normocephalic and atraumatic.  Reports soreness to the right posterior head.      Eyes: Conjunctivae are normal. Pupils equal and round.       Ears:         Nose: No congestion/rhinnorhea.   Mouth/Throat: Mucous membranes are moist.   Neck: No stridor.  C-spine step-off, patient does have some paraspinous tenderness to palpation and some mild midline soreness to the cervical spine. Cardiovascular/Chest: Irregularly irregular from 90s up to about 110.  No murmurs, rubs, or gallops. Respiratory: Normal respiratory effort without tachypnea nor retractions. Breath sounds are clear and equal bilaterally. No wheezes/rales/rhonchi. Gastrointestinal: Soft. No distention, no guarding, no rebound. Nontender.    Genitourinary/rectal:Deferred Musculoskeletal: No extremity deformities, patient has some tenderness to the anterior aspect of the right shoulder.  Stable nontender pelvis and lower extremities.  No edema. Neurologic:  Normal speech and language. No gross or focal neurologic deficits are appreciated. Skin:  Skin is warm, dry and intact. No rash noted. Psychiatric: Mood and affect are normal. Speech and behavior are normal. Patient exhibits appropriate insight and judgment.   ____________________________________________  LABS (pertinent positives/negatives) I, Governor Rooksebecca Charvi Gammage, MD the attending physician have reviewed the labs noted below.  Labs Reviewed  BASIC METABOLIC PANEL  TROPONIN I  CBC WITH DIFFERENTIAL/PLATELET  URINALYSIS, COMPLETE (UACMP) WITH MICROSCOPIC    ____________________________________________    EKG I, Governor Rooksebecca Ygnacio Fecteau, MD, the attending physician have personally viewed and interpreted all ECGs.  Reviewed from earlier today  irregularly irregular  This visits EKG.  127 bpm.  Atrial fibrillation.  Right bundle branch block.  Normal axis.  Nonspecific ST and T wave. ____________________________________________  RADIOLOGY All Xrays were viewed by me.  Imaging interpreted by Radiologist, and I, Governor Rooksebecca Anavi Branscum, MD the attending physician have reviewed the radiologist interpretation noted below.  CT cervical spine and head CT without contrast:  IMPRESSION: No acute intracranial abnormality.  No evidence of acute traumatic injury to the cervical spine.  Chronic left maxillary sinusitis.  7 mm pulmonary nodule versus pleuroparenchymal scarring in the right lung apex. Non-contrast chest CT at 6-12 months is recommended. If the nodule is stable at time of repeat CT, then future CT at 18-24 months (from today's scan) is considered optional for low-risk patients, but is recommended for high-risk patients. This recommendation follows the consensus statement: Guidelines for Management of Incidental Pulmonary Nodules Detected on CT Images: From the Fleischner Society 2017; Radiology 2017; 284:228-243.  Shoulder right dg:  IMPRESSION: No acute fracture or dislocation identified about the right shoulder. __________________________________________  PROCEDURES  Procedure(s) performed: None  Critical Care performed: None   ____________________________________________  ED COURSE / ASSESSMENT AND PLAN  Pertinent labs & imaging results that were available during my care of the patient were reviewed by me and considered in my medical decision making (see chart for details).  Had a second syncope today, hit head on heater and complaining of soreness to the neck and right shoulder.  No deformities.  From the standpoint of trauma, imaging is reassuring.  From the standpoint of the syncope - patient had multi day stay and work up for recurrent syncope within the past few weeks -- has afib and recently started on  midodrine for orthostatic hypotension.  Patient was seen earlier this morning so I did not repeat her blood work.  She is not reporting symptoms of infections.  Patient is having some rapid A. fib up into the 130s, she has not had her evening metoprolol dose.  Her caregiver right now and healthcare power of attorney, but not guardian is appearing to me as extremely brought out.  She became extremely frustrated at the fact that she cannot get any additional help for this patient in terms of insurance is not covering any additional assisted living or nursing home placement.  We had a long discussion about whether or not the patient will be able to choose to go home to her trailer.  In any case, given twice syncope in 1 day and the rapid A. fib, and the social situation which is suboptimal, I am going to admit her for medical evaluation for the syncope and observation, and social work consultation for ultimate disposition.  I went ahead and added on bloods test as well as urinalysis, pending at time of hospitalist consultation.  Signed out to ED physician Dr. Scotty Court, will bring chart to hospitalist upon labs returned.   CONSULTATIONS: Hospitalist for admission.  Patient / Family / Caregiver informed of clinical course, medical decision-making process, and agree with plan.  ___________________________________________   FINAL CLINICAL IMPRESSION(S) / ED DIAGNOSES   Final diagnoses:  Atrial fibrillation, rapid (HCC)  Syncope, unspecified syncope type      ___________________________________________        Note: This dictation was prepared with Dragon dictation. Any transcriptional errors that result from this process are unintentional    Governor Rooks, MD 10/10/17 2042    Governor Rooks, MD 10/10/17 2049    Governor Rooks, MD 10/10/17 2056

## 2017-10-10 NOTE — ED Notes (Signed)
Patient was changed out of wet diaper and changed into clean one.

## 2017-10-10 NOTE — ED Notes (Signed)
Lurena Joinerebecca, RN given report.

## 2017-10-10 NOTE — ED Provider Notes (Signed)
[facility] Emergency Department Provider Note   ____________________________________________    I have reviewed the triage vital signs and the nursing notes.   HISTORY  Chief Complaint Dizziness  History of dementia noted   HPI Meghan Lloyd is a 76 y.o. female who presents after apparent fall.  Patient reports that she has a lump on her head which she believes is from falling last night.  She says that she was told to come to the emergency department by her family but she did not want to.  Otherwise she feels well has no complaints.  No dizziness.  No nausea or vomiting or abdominal pain.  No focal deficits.  She denies history of blood thinners.   Past Medical History:  Diagnosis Date  . Atrial fibrillation (HCC)   . Diabetes mellitus without complication (HCC)   . Hypertension     Patient Active Problem List   Diagnosis Date Noted  . Syncope due to orthostatic hypotension 10/04/2017  . Dementia with behavioral disturbance 10/01/2017  . Syncope 09/30/2017    No past surgical history on file.  Prior to Admission medications   Medication Sig Start Date End Date Taking? Authorizing Provider  clopidogrel (PLAVIX) 75 MG tablet Take 75 mg by mouth daily.    [provider]  diltiazem (DILACOR XR) 180 MG 24 hr capsule Take 180 mg by mouth daily.    [provider]  metoprolol tartrate (LOPRESSOR) 25 MG tablet Take 1 tablet (25 mg total) by mouth 2 (two) times daily. 10/05/17   Auburn BilberryPatel, Shreyang, MD  midodrine (PROAMATINE) 10 MG tablet Take 1 tablet (10 mg total) by mouth 3 (three) times daily with meals. 10/05/17   Auburn BilberryPatel, Shreyang, MD  OLANZapine (ZYPREXA) 2.5 MG tablet Take 2.5 mg by mouth at bedtime.    [provider]  potassium chloride SA (K-DUR,KLOR-CON) 20 MEQ tablet Take 20 mEq by mouth daily.    [provider]  pravastatin (PRAVACHOL) 80 MG tablet Take 80 mg by mouth daily.    [provider]    QUEtiapine (SEROQUEL) 25 MG tablet Take 0.5 tablets (12.5 mg total) by mouth every 6 (six) hours as needed (anxiety). 10/05/17   Auburn BilberryPatel, Shreyang, MD     Allergies Patient has no known allergies.  No family history on file.  Social History Social History   Tobacco Use  . Smoking status: Current Every Day Smoker  . Smokeless tobacco: Never Used  Substance Use Topics  . Alcohol use: Yes  . Drug use: Not on file    Review of Systems  Constitutional: No fever/chills Eyes: No visual changes.  ENT: No neck pain Cardiovascular: Denies chest pain. Respiratory: Denies cough Gastrointestinal: No abdominal pain Genitourinary: Negative for dysuria. Musculoskeletal: Negative for back pain. Skin: Negative for rash. Neurological: Negative for headaches    ____________________________________________   PHYSICAL EXAM:  VITAL SIGNS: ED Triage Vitals  Enc Vitals Group     BP 10/10/17 1030 (!) 162/110     Pulse Rate 10/10/17 1030 91     Resp 10/10/17 1030 13     Temp 10/10/17 1030 (!) 97.5 F (36.4 C)     Temp Source 10/10/17 1030 Oral     SpO2 10/10/17 1030 96 %     Weight 10/10/17 1027 74.8 kg (165 lb)     Height 10/10/17 1027 1.702 m (5\' 7" )     Head Circumference --      Peak Flow --  Pain Score 10/10/17 1327 0     Pain Loc --      Pain Edu? --      Excl. in GC? --     Constitutional: Alert. No acute distress. Pleasant and interactive Eyes: Conjunctivae are normal.  Head: 2 x 2 cmhematoma, no active bleeding, left parietal area  Mouth/Throat: Mucous membranes are moist.   Neck: No vertebral tenderness topalpation Cardiovascular:   Good peripheral circulation. Respiratory: Normal respiratory effort.  No retractions.  Gastrointestinal: Soft and nontender. No distention.   Genitourinary: deferred Musculoskeletal: No lower extremity tenderness nor edema.  Warm and well perfused Neurologic:  Normal speech and language. No gross focal neurologic deficits are  appreciated.  Skin:  Skin is warm, dry and intact. No rash noted. Psychiatric: Mood and affect are normal. Speech and behavior are normal.  ____________________________________________   LABS (all labs ordered are listed, but only abnormal results are displayed)  Labs Reviewed  CBC - Abnormal; Notable for the following components:      Result Value   Platelets 98 (*)    All other components within normal limits  COMPREHENSIVE METABOLIC PANEL - Abnormal; Notable for the following components:   Glucose, Bld 251 (*)    BUN 29 (*)    Creatinine, Ser 1.17 (*)    GFR calc non Af Amer 44 (*)    GFR calc Af Amer 51 (*)    All other components within normal limits   ____________________________________________  EKG  ED ECG REPORT I, Jene EveryKINNER, Luvenia Cranford, the attending physician, personally viewed and interpreted this ECG.  Date: 10/10/2017  Rhythm: Atrial fibrillation QRS Axis: normal Intervals: normal ST/T Wave abnormalities: Nonspecific Narrative Interpretation: no evidence of acute ischemia  ____________________________________________  RADIOLOGY  CT head unremarkable ____________________________________________   PROCEDURES  Procedure(s) performed: No  Procedures   Critical Care performed: No ____________________________________________   INITIAL IMPRESSION / ASSESSMENT AND PLAN / ED COURSE  Pertinent labs & imaging results that were available during my care of the patient were reviewed by me and considered in my medical decision making (see chart for details).  Patient presents after reported fall.  Recent admission for orthostatic syncope, started on midodrine with improvement.  Given evidence of head trauma will repeat CT check basic labs  Workup is reassuring, discussed with power of attorney.  I do not see a significant benefit to hospitalization and I discussed this with patient and family and they agree with this plan.  Will discharge home with outpatient  follow-up with PCP to discuss possible nursing home placement, to be noted the patient is adamantly against this and wants to live at home    ____________________________________________   FINAL CLINICAL IMPRESSION(S) / ED DIAGNOSES  Final diagnoses:  Injury of head, initial encounter        Note:  This document was prepared using Dragon voice recognition software and may include unintentional dictation errors.    Jene EveryKinner, Cortlan Dolin, MD 10/10/17 [phone]

## 2017-10-10 NOTE — ED Notes (Signed)
Dr. Shaune PollackLord in to evaluate pt.

## 2017-10-10 NOTE — ED Notes (Addendum)
Pt stating two falls today. Pt stating that she doesn't remember her first fall. Pt stating that she gets dizzy at times and usually sits down but was not able to today. Pt stating that she has a "bump" on the left portion of her head. Nurse having pt move arms and pt has pain in right shoulder. Pt has no evident bruising or deformity. Pt stating some pain in her RLE near her shin. Pt in NAD and speaking clearly. Pt's daughter is not available at this time and pt stating that she went to get food. Pt is awake and alert. Pt is not orientated to situation.

## 2017-10-10 NOTE — ED Triage Notes (Signed)
Presents via ems from home s/p syncopal episode  States she was seen earlier for same  fsbs 355  Also now having right shoulder and hip pain

## 2017-10-10 NOTE — ED Notes (Signed)
Pt was seen earlier to day for previous fall and bump to head was from fall this morning according to Dr. Fransisca ConnorsKinner's notes.

## 2017-10-10 NOTE — ED Triage Notes (Signed)
Pt arrived via EMS from home for reports of continued dizziness. EMS reports pt alert and oriented. Pt has small knot noted on left side of head and does not know how it got there. EMS reports VSS, a-fib, CBG 246.

## 2017-10-10 NOTE — ED Notes (Addendum)
Nurse Eugene GarnetKarimah asked why urine was not obtained in ED. Nurse stated that order was added on later and patient had not stated that she needed to use the restroom. No order for in and out cath had been ordered and admitting MD (Dr. Anne HahnWillis) was ok with urine being collected upstairs. Floor nurse asked myself what was being done about patient's BUN and creatinine and this nurse went and told Dr. Anne HahnWillis the patient's BUN and creatinine values and asked if he wanted any additional orders and he thanked me for telling me and said he was not putting in any additional orders and was ok to go to floor. ED RN Iris went to help transport patient and tried to explain the situation to floor nurse.

## 2017-10-11 ENCOUNTER — Observation Stay: Payer: Medicare Other

## 2017-10-11 ENCOUNTER — Encounter: Payer: Self-pay | Admitting: Nurse Practitioner

## 2017-10-11 DIAGNOSIS — F0391 Unspecified dementia with behavioral disturbance: Secondary | ICD-10-CM | POA: Diagnosis not present

## 2017-10-11 DIAGNOSIS — R55 Syncope and collapse: Secondary | ICD-10-CM

## 2017-10-11 DIAGNOSIS — F0281 Dementia in other diseases classified elsewhere with behavioral disturbance: Secondary | ICD-10-CM | POA: Diagnosis not present

## 2017-10-11 DIAGNOSIS — I4891 Unspecified atrial fibrillation: Secondary | ICD-10-CM

## 2017-10-11 DIAGNOSIS — G301 Alzheimer's disease with late onset: Secondary | ICD-10-CM | POA: Diagnosis not present

## 2017-10-11 LAB — CBC
HEMATOCRIT: 37.6 % (ref 35.0–47.0)
HEMOGLOBIN: 12.9 g/dL (ref 12.0–16.0)
MCH: 32 pg (ref 26.0–34.0)
MCHC: 34.2 g/dL (ref 32.0–36.0)
MCV: 93.7 fL (ref 80.0–100.0)
Platelets: 93 10*3/uL — ABNORMAL LOW (ref 150–440)
RBC: 4.01 MIL/uL (ref 3.80–5.20)
RDW: 14.7 % — AB (ref 11.5–14.5)
WBC: 6.2 10*3/uL (ref 3.6–11.0)

## 2017-10-11 LAB — URINALYSIS, COMPLETE (UACMP) WITH MICROSCOPIC
Bacteria, UA: NONE SEEN
Bilirubin Urine: NEGATIVE
Ketones, ur: NEGATIVE mg/dL
Leukocytes, UA: NEGATIVE
NITRITE: NEGATIVE
PH: 5 (ref 5.0–8.0)
PROTEIN: 30 mg/dL — AB
Specific Gravity, Urine: 1.021 (ref 1.005–1.030)

## 2017-10-11 LAB — GLUCOSE, CAPILLARY
GLUCOSE-CAPILLARY: 246 mg/dL — AB (ref 65–99)
GLUCOSE-CAPILLARY: 198 mg/dL — AB (ref 65–99)
Glucose-Capillary: 151 mg/dL — ABNORMAL HIGH (ref 65–99)
Glucose-Capillary: 234 mg/dL — ABNORMAL HIGH (ref 65–99)
Glucose-Capillary: 177 mg/dL — ABNORMAL HIGH (ref 65–99)

## 2017-10-11 LAB — BASIC METABOLIC PANEL
ANION GAP: 8 (ref 5–15)
BUN: 28 mg/dL — AB (ref 6–20)
CALCIUM: 9 mg/dL (ref 8.9–10.3)
CO2: 26 mmol/L (ref 22–32)
Chloride: 103 mmol/L (ref 101–111)
Creatinine, Ser: 1.04 mg/dL — ABNORMAL HIGH (ref 0.44–1.00)
GFR calc Af Amer: 59 mL/min — ABNORMAL LOW (ref >60)
GFR calc non Af Amer: 50 mL/min — ABNORMAL LOW (ref >60)
GLUCOSE: 280 mg/dL — AB (ref 65–99)
Potassium: 4.1 mmol/L (ref 3.5–5.1)
Sodium: 137 mmol/L (ref 135–145)

## 2017-10-11 MED ORDER — METOPROLOL TARTRATE 25 MG PO TABS
25.0000 mg | ORAL_TABLET | Freq: Two times a day (BID) | ORAL | Status: DC
  Administered 2017-10-11 – 2017-10-12 (×3): 25 mg via ORAL
  Filled 2017-10-11 (×4): qty 1

## 2017-10-11 MED ORDER — ACETAMINOPHEN 650 MG RE SUPP: 650.0000 mg | Freq: Four times a day (QID) | RECTAL | Status: DC | PRN

## 2017-10-11 MED ORDER — ACETAMINOPHEN 325 MG PO TABS: 650.0000 mg | ORAL_TABLET | Freq: Four times a day (QID) | ORAL | Status: DC | PRN

## 2017-10-11 MED ORDER — ONDANSETRON HCL 4 MG PO TABS: 4.0000 mg | ORAL_TABLET | Freq: Four times a day (QID) | ORAL | Status: DC | PRN

## 2017-10-11 MED ORDER — FLUCONAZOLE 50 MG PO TABS
150.0000 mg | ORAL_TABLET | Freq: Once | ORAL | Status: AC
  Administered 2017-10-11: 150 mg via ORAL
  Filled 2017-10-11 (×2): qty 3

## 2017-10-11 MED ORDER — MIDODRINE HCL 5 MG PO TABS
10.0000 mg | ORAL_TABLET | Freq: Three times a day (TID) | ORAL | Status: DC
  Administered 2017-10-11 – 2017-10-12 (×3): 10 mg via ORAL
  Filled 2017-10-11 (×4): qty 2

## 2017-10-11 MED ORDER — OLANZAPINE 2.5 MG PO TABS
2.5000 mg | ORAL_TABLET | Freq: Every day | ORAL | Status: DC
  Filled 2017-10-11: qty 1

## 2017-10-11 MED ORDER — INSULIN ASPART 100 UNIT/ML ~~LOC~~ SOLN
0.0000 [IU] | Freq: Every day | SUBCUTANEOUS | Status: DC
  Administered 2017-10-11: 3 [IU] via SUBCUTANEOUS
  Filled 2017-10-11: qty 1

## 2017-10-11 MED ORDER — INSULIN ASPART 100 UNIT/ML ~~LOC~~ SOLN
3.0000 [IU] | Freq: Once | SUBCUTANEOUS | Status: AC
  Administered 2017-10-11: 3 [IU] via SUBCUTANEOUS

## 2017-10-11 MED ORDER — ONDANSETRON HCL 4 MG/2ML IJ SOLN: 4.0000 mg | Freq: Four times a day (QID) | INTRAMUSCULAR | Status: DC | PRN

## 2017-10-11 MED ORDER — OLANZAPINE 5 MG PO TABS
5.0000 mg | ORAL_TABLET | Freq: Every day | ORAL | Status: DC
  Administered 2017-10-11: 5 mg via ORAL
  Filled 2017-10-11: qty 1

## 2017-10-11 MED ORDER — CLOPIDOGREL BISULFATE 75 MG PO TABS
75.0000 mg | ORAL_TABLET | Freq: Every day | ORAL | Status: DC
  Administered 2017-10-11 – 2017-10-12 (×2): 75 mg via ORAL
  Filled 2017-10-11 (×2): qty 1

## 2017-10-11 MED ORDER — INSULIN ASPART 100 UNIT/ML ~~LOC~~ SOLN
0.0000 [IU] | Freq: Three times a day (TID) | SUBCUTANEOUS | Status: DC
  Administered 2017-10-11: 2 [IU] via SUBCUTANEOUS
  Administered 2017-10-11 – 2017-10-12 (×2): 3 [IU] via SUBCUTANEOUS
  Administered 2017-10-12: 2 [IU] via SUBCUTANEOUS
  Filled 2017-10-11 (×5): qty 1

## 2017-10-11 MED ORDER — ENOXAPARIN SODIUM 40 MG/0.4ML ~~LOC~~ SOLN
40.0000 mg | SUBCUTANEOUS | Status: DC
  Administered 2017-10-11: 40 mg via SUBCUTANEOUS
  Filled 2017-10-11: qty 0.4

## 2017-10-11 MED ORDER — QUETIAPINE FUMARATE 25 MG PO TABS: 12.5000 mg | ORAL_TABLET | Freq: Four times a day (QID) | ORAL | Status: DC | PRN

## 2017-10-11 MED ORDER — SODIUM CHLORIDE 0.9% FLUSH
3.0000 mL | Freq: Two times a day (BID) | INTRAVENOUS | Status: DC
  Administered 2017-10-11 – 2017-10-12 (×2): 3 mL via INTRAVENOUS

## 2017-10-11 MED ORDER — PRAVASTATIN SODIUM 20 MG PO TABS
80.0000 mg | ORAL_TABLET | Freq: Every day | ORAL | Status: DC
  Administered 2017-10-11 – 2017-10-12 (×2): 80 mg via ORAL
  Filled 2017-10-11: qty 2
  Filled 2017-10-11: qty 4
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
## 2017-10-11 NOTE — Consult Note (Signed)
Cardiology Consult    Patient ID: Meghan Lloyd MRN: [id number], DOB/AGE: Aug 22, 1941   Admit date: 10/10/2017 Date of Consult: 10/11/2017  Primary Physician: Raynelle Bring Primary Cardiologist: Julien Nordmann, MD Requesting Provider: Suzanne Boron, MD  Patient Profile    Meghan Lloyd is a 76 y.o. female with a history of Afib, dementia, syncope, orthostatic hypotension, HTN, and dementia, who is being seen today for the evaluation of syncope at the request of Dr. Luberta Mutter.  Past Medical History   Past Medical History:  Diagnosis Date  . Dementia   . Diabetes mellitus without complication (HCC)   . Hypertension   . Orthostatic hypotension   . Persistent atrial fibrillation (HCC)    a. CHA2DS2VASc = 5-->No OAC in setting of dementia, ? compliance, falls.  . Syncope    a. likely 2/2 orthostasis;  b. 09/2017 Echo: Ef 50-55%, no rwma, mild AI/MR, mod dil LA/RA, mod TR, PASP ; c. 09/2017 Carotid U/S: <50% bilat ICA stenosis.    History reviewed. No pertinent surgical history.   Allergies  No Known Allergies  History of Present Illness    76 year old female with a history of persistent atrial fibrillation of unknown duration, dementia, syncope in the setting of orthostatic hypotension, hypertension, and dementia.  She was recently admitted to [facility] on December 2 following a syncopal spell.  During that admission, echocardiogram showed normal LV function with mild valvular disease.  She was found to be orthostatic and ranitidine was added.  Initially, there was difficulty in managing her rapid atrial fibrillation though eventually, she was able to tolerate beta-blocker and diltiazem therapy.  Beta-blocker dose did require reduction in the setting of up to 2.2-second pauses.  She was discharged home on December 7.  She stays with her daughter in [place].  Her daughter is not present with her today and is not available by phone currently.  The patient's dementia  limits her ability to participate in interview.  She says she came to the hospital because someone got mad at home.  She does not recall the events that led to her rehospitalization but per H&P, she apparently had recurrent syncope and fall and she was taken back to the emergency department.  Head CT in the emergency department did not show any acute intracranial findings.  She was noted to have rapid atrial fibrillation with rates into the 130s.  She was subsequently admitted and placed back on home dose of metoprolol.  Rates have been stable in the 90s to low 100s.  She currently denies any chest pain, dyspnea, palpitations, or presyncope.    Inpatient Medications    . clopidogrel  75 mg Oral Daily  . enoxaparin (LOVENOX) injection  40 mg Subcutaneous Q24H  . insulin aspart  0-5 Units Subcutaneous QHS  . insulin aspart  0-9 Units Subcutaneous TID WC  . metoprolol tartrate  25 mg Oral BID  . midodrine  10 mg Oral TID WC  . pravastatin  80 mg Oral Daily    Family History    Family History  Family history unknown: Yes  No premature CAD.  Social History    Social History   Socioeconomic History  . Marital status: Widowed    Spouse name: Not on file  . Number of children: Not on file  . Years of education: Not on file  . Highest education level: Not on file  Social Needs  . Financial resource strain: Not on file  . Food insecurity - worry: Not on file  .  Food insecurity - inability: Not on file  . Transportation needs - medical: Not on file  . Transportation needs - non-medical: Not on file  Occupational History  . Not on file  Tobacco Use  . Smoking status: Current Every Day Smoker  . Smokeless tobacco: Never Used  Substance and Sexual Activity  . Alcohol use: Yes  . Drug use: No  . Sexual activity: Not on file  Other Topics Concern  . Not on file  Social History Narrative   [place]ives [place].     Review of Systems    General:  No chills, fever, night sweats  or weight changes.  Cardiovascular:  No chest pain, dyspnea on exertion, edema, orthopnea, palpitations, paroxysmal nocturnal dyspnea. +++ presyncope/syncope as outlined above. Dermatological: No rash, lesions/masses Respiratory: No cough, dyspnea Urologic: No hematuria, dysuria Abdominal:   No nausea, vomiting, diarrhea, bright red blood per rectum, melena, or hematemesis Neurologic:  AMS in setting of dementia.  No visual changes, wkns. All other systems reviewed and are otherwise negative except as noted above.  Physical Exam    Blood pressure (!) 100/47, pulse 94, temperature 97.7 F (36.5 C), resp. rate 18, height 5\' 7"  (1.702 m), weight 168 lb (76.2 kg), SpO2 98 %.  General: Pleasant, NAD Psych: Normal affect. Neuro: Disoriented to time.. Moves all extremities spontaneously. HEENT: Normal  Neck: Supple without bruits or JVD. Lungs:  Resp regular and unlabored, CTA. Heart: Irregularly irregular, no s3, s4, or murmurs. Abdomen: Soft, non-tender, non-distended, BS + x 4.  Extremities: No clubbing, cyanosis or edema. DP/PT/Radials 2+ and equal bilaterally.  Labs     Recent Labs    10/10/17 2100  TROPONINI <0.03   Lab Results  Component Value Date   WBC 6.2 10/11/2017   HGB 12.9 10/11/2017   HCT 37.6 10/11/2017   MCV 93.7 10/11/2017   PLT 93 (L) 10/11/2017    Recent Labs  Lab 10/10/17 1150  10/11/17 0556  NA 136   < > 137  K 4.4   < > 4.1  CL 103   < > 103  CO2 26   < > 26  BUN 29*   < > 28*  CREATININE 1.17*   < > 1.04*  CALCIUM 8.9   < > 9.0  PROT 6.7  --   --   BILITOT 1.0  --   --   ALKPHOS 60  --   --   ALT 21  --   --   AST 21  --   --   GLUCOSE 251*   < > 280*   < > = values in this interval not displayed.     Radiology Studies    Dg Thoracic Spine 2 View  Result Date: 09/30/2017 CLINICAL DATA:  Upper back pain. EXAM: THORACIC SPINE 2 VIEWS COMPARISON:  None. FINDINGS: There is no evidence of thoracic spine fracture. Alignment is normal. No other  significant bone abnormalities are identified. IMPRESSION: Negative. Electronically Signed   By: Kennith Center M.D.   On: 09/30/2017 14:43   Dg Shoulder Right  Result Date: 10/10/2017 CLINICAL DATA:  Right shoulder pain after multiple falls. EXAM: RIGHT SHOULDER - 2+ VIEW COMPARISON:  None. FINDINGS: There is no evidence of fracture or dislocation. 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 Soft tissues calcifications adjacent to the superior portion of the glenoid likely represent calcific tendinitis. IMPRESSION: No acute fracture or dislocation identified about the right shoulder. Electronically Signed   By:  Ted Mcalpineobrinka  Dimitrova M.D.   On: 10/10/2017 18:53   Ct Head Wo Contrast  Result Date: 10/10/2017 CLINICAL DATA:  Status post 2 falls today. EXAM: CT HEAD WITHOUT CONTRAST CT CERVICAL SPINE WITHOUT CONTRAST TECHNIQUE: Multidetector CT imaging of the head and cervical spine was performed following the standard protocol without intravenous contrast. Multiplanar CT image reconstructions of the cervical spine were also generated. COMPARISON:  10/10/2017 FINDINGS: CT HEAD FINDINGS Brain: No evidence of acute infarction, hemorrhage, hydrocephalus, extra-axial collection or mass lesion/mass effect. Mild brain parenchymal volume loss and periventricular microangiopathy. Vascular: Calcific atherosclerotic disease of intra cavernous internal carotid arteries. Skull: Normal. Negative for fracture or focal lesion. Sinuses/Orbits: Dense opacification of the left maxillary sinus with high density material with periosteal thickening in the sinus walls, evidence of chronic sinusitis. Other: Left parietal scalp hematoma. CT CERVICAL SPINE FINDINGS Alignment: Normal. Skull base and vertebrae: No acute fracture. No primary bone lesion or focal pathologic process. Soft tissues and spinal canal: No prevertebral fluid or swelling. No visible canal hematoma. Disc levels: Multilevel osteoarthritic changes, with  prominent posterior facet arthropathy. Upper chest: Right apical subpleural scarring. 7 mm nodular area in the right apex may represent scarring versus a pulmonary nodule, image 83/95, sequence 6. Other: None. IMPRESSION: No acute intracranial abnormality. No evidence of acute traumatic injury to the cervical spine. Chronic left maxillary sinusitis. 7 mm pulmonary nodule versus pleuroparenchymal scarring in the right lung apex. Non-contrast chest CT at 6-12 months is recommended. If the nodule is stable at time of repeat CT, then future CT at 18-24 months (from today's scan) is considered optional for low-risk patients, but is recommended for high-risk patients. This recommendation follows the consensus statement: Guidelines for Management of Incidental Pulmonary Nodules Detected on CT Images: From the Fleischner Society 2017; Radiology 2017; 284:228-243. Electronically Signed   By: Ted Mcalpineobrinka  Dimitrova M.D.   On: 10/10/2017 19:04   Ct Head Wo Contrast  Result Date: 10/10/2017 CLINICAL DATA:  Dizziness.  Possible head injury. EXAM: CT HEAD WITHOUT CONTRAST TECHNIQUE: Contiguous axial images were obtained from the base of the skull through the vertex without intravenous contrast. COMPARISON:  CT scan of September 30, 2017. FINDINGS: Brain: Mild diffuse cortical atrophy is noted. Mild chronic ischemic white matter disease is noted. No mass effect or midline shift is noted. Ventricular size is within normal limits. There is no evidence of mass lesion, hemorrhage or acute infarction. Vascular: No hyperdense vessel or unexpected calcification. Skull: Normal. Negative for fracture or focal lesion. Sinuses/Orbits: No acute finding. Other: Small left parietal scalp hematoma is noted. IMPRESSION: Small left parietal scalp hematoma. Mild diffuse cortical atrophy. Mild chronic ischemic white matter disease. No acute intracranial abnormality seen. Electronically Signed   By: Lupita RaiderJames  Green Jr, M.D.   On: 10/10/2017 12:32   Ct  Head Wo Contrast  Result Date: 09/30/2017 CLINICAL DATA:  Syncope with dizziness. EXAM: CT HEAD WITHOUT CONTRAST TECHNIQUE: Contiguous axial images were obtained from the base of the skull through the vertex without intravenous contrast. COMPARISON:  None. FINDINGS: Brain: There is no evidence for acute hemorrhage, hydrocephalus, mass lesion, or abnormal extra-axial fluid collection. No definite CT evidence for acute infarction. Diffuse loss of parenchymal volume is consistent with atrophy. Vascular: No hyperdense vessel or unexpected calcification. Skull: No evidence for fracture. No worrisome lytic or sclerotic lesion. Sinuses/Orbits: The visualized paranasal sinuses and mastoid air cells are clear. Visualized portions of the globes and intraorbital fat are unremarkable. Other: None. IMPRESSION: 1. No acute intracranial abnormality.  Electronically Signed   By: Kennith CenterEric  Mansell M.D.   On: 09/30/2017 11:11   Ct Cervical Spine Wo Contrast  Result Date: 10/10/2017 CLINICAL DATA:  Status post 2 falls today. EXAM: CT HEAD WITHOUT CONTRAST CT CERVICAL SPINE WITHOUT CONTRAST TECHNIQUE: Multidetector CT imaging of the head and cervical spine was performed following the standard protocol without intravenous contrast. Multiplanar CT image reconstructions of the cervical spine were also generated. COMPARISON:  10/10/2017 FINDINGS: CT HEAD FINDINGS Brain: No evidence of acute infarction, hemorrhage, hydrocephalus, extra-axial collection or mass lesion/mass effect. Mild brain parenchymal volume loss and periventricular microangiopathy. Vascular: Calcific atherosclerotic disease of intra cavernous internal carotid arteries. Skull: Normal. Negative for fracture or focal lesion. Sinuses/Orbits: Dense opacification of the left maxillary sinus with high density material with periosteal thickening in the sinus walls, evidence of chronic sinusitis. Other: Left parietal scalp hematoma. CT CERVICAL SPINE FINDINGS Alignment: Normal.  Skull base and vertebrae: No acute fracture. No primary bone lesion or focal pathologic process. Soft tissues and spinal canal: No prevertebral fluid or swelling. No visible canal hematoma. Disc levels: Multilevel osteoarthritic changes, with prominent posterior facet arthropathy. Upper chest: Right apical subpleural scarring. 7 mm nodular area in the right apex may represent scarring versus a pulmonary nodule, image 83/95, sequence 6. Other: None. IMPRESSION: No acute intracranial abnormality. No evidence of acute traumatic injury to the cervical spine. Chronic left maxillary sinusitis. 7 mm pulmonary nodule versus pleuroparenchymal scarring in the right lung apex. Non-contrast chest CT at 6-12 months is recommended. If the nodule is stable at time of repeat CT, then future CT at 18-24 months (from today's scan) is considered optional for low-risk patients, but is recommended for high-risk patients. This recommendation follows the consensus statement: Guidelines for Management of Incidental Pulmonary Nodules Detected on CT Images: From the Fleischner Society 2017; Radiology 2017; 284:228-243. Electronically Signed   By: Ted Mcalpineobrinka  Dimitrova M.D.   On: 10/10/2017 19:04   [place] Carotid Bilateral  Result Date: 10/01/2017 CLINICAL DATA:  Syncope EXAM: BILATERAL CAROTID DUPLEX ULTRASOUND TECHNIQUE: Wallace CullensGray scale imaging, color Doppler and duplex ultrasound were performed of bilateral carotid and vertebral arteries in the neck. COMPARISON:  None. FINDINGS: Criteria: Quantification of carotid stenosis is based on velocity parameters that correlate the residual internal carotid diameter with NASCET-based stenosis levels, using the diameter of the distal internal carotid lumen as the denominator for stenosis measurement. The following velocity measurements were obtained: RIGHT ICA:  64 cm/sec CCA:  69 cm/sec SYSTOLIC ICA/CCA RATIO:  0.9 DIASTOLIC ICA/CCA RATIO:  1.9 ECA:  78 cm/sec LEFT ICA:  87 cm/sec CCA:  49 cm/sec SYSTOLIC  ICA/CCA RATIO:  1.8 DIASTOLIC ICA/CCA RATIO:  3.0 ECA:  99 cm/sec RIGHT CAROTID ARTERY: Mild irregular soft plaque in the bulb. There is mild calcified plaque along the wall of the lower internal carotid artery. Low resistance internal carotid Doppler pattern is preserved. RIGHT VERTEBRAL ARTERY:  Antegrade. LEFT CAROTID ARTERY: Little if any plaque in the bulb. There is mild calcified plaque in the lower internal carotid artery. There is moderate irregular calcified plaque in the mid internal carotid artery. Low resistance internal carotid Doppler pattern is preserved. LEFT VERTEBRAL ARTERY:  Antegrade. IMPRESSION: Less than 50% stenosis in the right and left internal carotid arteries. Electronically Signed   By: Jolaine ClickArthur  Hoss M.D.   On: 10/01/2017 11:09   Dg Chest Portable 1 View  Result Date: 09/30/2017 CLINICAL DATA:  Syncope EXAM: PORTABLE CHEST 1 VIEW COMPARISON:  None. FINDINGS: Heart is borderline in size.  Lungs are clear. No effusions or acute bony abnormality. IMPRESSION: Mild cardiomegaly.  No active disease. Electronically Signed   By: Charlett Nose M.D.   On: 09/30/2017 11:28    ECG & Cardiac Imaging    Atrial fibrillation, 127, right bundle branch block, inferolateral ST depression.  On telemetry, she is in atrial fibrillation, mostly 70s-80s but occasional rises into the 130s.  Assessment & Plan    1.  Syncope: Patient admitted with recurrent syncope in the setting of known/documented orthostatic hypotension during her last admission a week ago.  Circumstances surrounding most recent syncopal episode are unclear as patient cannot recall and her daughter is not currently available by phone.  During last admission, she was noted to have persistent atrial fibrillation on telemetry which was rate controlled on beta-blocker therapy.  She did have occasional pauses of up to 2.2 seconds which resulted in our discontinuation of digoxin therapy during that hospitalization.  She was also noted to  be orthostatic by blood pressure with significant drops and as result, she was placed on midodrine.  Echocardiogram showed normal LV function well carotid ultrasound did not show any significant disease.  It is not clear what her compliance with medications has been since her discharge.  We will need to continue to reach out to her daughter to better understand what led to this admission and also what her compliance has been medications at home.  2.  Persistent atrial fibrillation: Unknown duration.  Asymptomatic and previously tolerating beta-blocker.  Limited by orthostasis.  Medication compliance at home unclear given rapid rates and presentation.  Rate currently 70s-80s for the most part.  Continue beta-blocker.  CHA2DS2VASc 5-6.  No oral anticoagulation in the setting of dementia and frequent falls.  If there is a decision to move her towards a skilled nursing facility, we could reconsider this.  3.  Orthostatic hypotension: See #1.  Continue midodrine.  Follow orthostatics here.  Signed, Nicolasa Ducking, NP 10/11/2017, 12:16 PM  For questions or updates, please contact   Please consult [email or web address] for contact info under Cardiology/STEMI.

## 2017-10-11 NOTE — Care Management (Addendum)
[facility].  Patient also does not have a pcp to sign the orders as an appointment was not made  Late entry- forgot to document this information 12.13.2018- Patient receives 2800 dollars a month. Per step daughter in law- "she has a lot of bills." Says that if she needs placement and money to cover it will just have to be turned over to collections. [name] says she does not know what patient does with her money.  she was given 300 dollars the other day to buy christmas presents and now only has twenty five dollars left.  Unable to account for how the money was spent.

## 2017-10-11 NOTE — Consult Note (Signed)
[facility] Face-to-Face Psychiatry Consult   Reason for Consult: Consult for 76 year old woman with a history of dementia.  Concerned this time specifically about capacity Referring Physician:  Sherian Rein Patient Identification: Meghan Lloyd MRN:  [id number] Principal Diagnosis: Dementia with behavioral disturbance Diagnosis:   Patient Active Problem List   Diagnosis Date Noted  . Atrial fibrillation with RVR ([place]) [I48.91] 10/10/2017  . HTN (hypertension) [I10] 10/10/2017  . Diabetes ([place]) [E11.9] 10/10/2017  . Syncope due to orthostatic hypotension [I95.1] 10/04/2017  . Dementia with behavioral disturbance [F03.91] 10/01/2017  . Syncope [R55] 09/30/2017    Total Time spent with patient: 1 hour  Subjective:   Meghan Lloyd is a 76 y.o. female patient admitted with "I came to this place to file some papers".  HPI: Patient interviewed chart reviewed.  I saw this patient twice on her last hospitalization as well.  Concern this time is specifically about her capacity.  Behaviors appear to of worsened at home and it is becoming more clear that the patient needs a level of care other than what can be provided at the home of her stepson and his family.  On interview today the patient was reasonably pleasant and mostly cooperative.  She is, however, completely disoriented.  She told me that she was currently at the [employer]" and that she had been in this building for 1 year.  At the same time she told me she had driven herself here today to do some work on the Internet and would be driving herself back [place] as soon as she could find her car keys.  Patient was able to acknowledge that it was a hospital when I pointed out to her but was not able to articulate a medical reason for being here.  Patient says her mood is okay she is just in a hurry to get home.  She admits that she is sleeping poorly.  Appears to be eating fairly adequately.  Denies suicidal or homicidal  ID.  Patient was agitated throughout the interview.  She was going through her purse apparently looking for her car keys but then would become easily distracted and start playing with stacks of paper sorting through them in a confused manner.  I made several attempts to reorient her to her situation but she was not able to stay consistently focused.  I did not perform a full mental status exam but it is clear that she would do very poorly.  As I mentioned she was unable to tell me where she was or what town she was in was unable to tell me anything about the year or date or why she was here.  She understood what I was asking when I tried to get her to count serial sevens but was not able to do the first step without getting distracted.  Unable to copy a figure.  Interestingly she was able to write 1 coherent sentence although it had a paranoid tone to it.  Social history: Patient had previously been living by herself [place].  Behavior and self-care had deteriorated.  If I understand correctly there was one attempt to place her in some kind of assisted living but the patient became agitated and had to be taken out of there.  Most recently she has been staying with her stepson and her stepson's wife.  These are her only remaining close family.  When asked about them she frequently becomes paranoid.  Medical  history: Patient has diabetes several episodes of syncope hypertension multiple medical problems none of which she was able to articulate to me.  Substance abuse history: Nothing acute.  Past Psychiatric History: I saw her twice during her last hospitalization which was just over a week ago.  On those occasions the question was not specifically about capacity but she was also clearly very demented then.  No known history of suicide attempts no history of treatment of depression no psychiatric hospitalization.  She is on a low dose of olanzapine to help with sundowning and agitated  paranoid behavior  Risk to Self: Is patient at risk for suicide?: No Risk to Others:   Prior Inpatient Therapy:   Prior Outpatient Therapy:    Past Medical History:  Past Medical History:  Diagnosis Date  . Dementia   . Diabetes mellitus without complication ([place])   . Hypertension   . Orthostatic hypotension   . Persistent atrial fibrillation (HCC)    a. CHA2DS2VASc = 5-->No OAC in setting of dementia, ? compliance, falls.  . Syncope    a. likely 2/2 orthostasis;  b. 09/2017 Echo: Ef 50-55%, no rwma, mild AI/MR, mod dil LA/RA, mod TR, PASP 89mHg; c. 09/2017 Carotid U/S: <50% bilat ICA stenosis.   History reviewed. No pertinent surgical history. Family History:  Family History  Family history unknown: Yes   Family Psychiatric  History: None known Social History:  Social History   Substance and Sexual Activity  Alcohol Use Yes     Social History   Substance and Sexual Activity  Drug Use No    Social History   Socioeconomic History  . Marital status: Widowed    Spouse name: None  . Number of children: None  . Years of education: None  . Highest education level: None  Social Needs  . Financial resource strain: None  . Food insecurity - worry: None  . Food insecurity - inability: None  . Transportation needs - medical: None  . Transportation needs - non-medical: None  Occupational History  . None  Tobacco Use  . Smoking status: Current Every Day Smoker  . Smokeless tobacco: Never Used  Substance and Sexual Activity  . Alcohol use: Yes  . Drug use: No  . Sexual activity: None  Other Topics Concern  . None  Social History Narrative   [place]   Additional Social History:    Allergies:  No Known Allergies  Labs:  Results for orders placed or performed during the hospital encounter of 10/10/17 (from the past 48 hour(s))  Basic metabolic panel     Status: Abnormal   Collection Time: 10/10/17  9:00 PM  Result Value Ref Range   Sodium 138  135 - 145 mmol/L   Potassium 4.4 3.5 - 5.1 mmol/L   Chloride 104 101 - 111 mmol/L   CO2 24 22 - 32 mmol/L   Glucose, Bld 261 (H) 65 - 99 mg/dL   BUN 30 (H) 6 - 20 mg/dL   Creatinine, Ser 1.08 (H) 0.44 - 1.00 mg/dL   Calcium 9.1 8.9 - 10.3 mg/dL   GFR calc non Af Amer 48 (L) >60 mL/min   GFR calc Af Amer 56 (L) >60 mL/min    Comment: (NOTE) The eGFR has been calculated using the CKD EPI equation. This calculation has not been validated in all clinical situations. eGFR's persistently <60 mL/min signify possible Chronic Kidney Disease.    Anion gap 10 5 - 15  Troponin I  Status: None   Collection Time: 10/10/17  9:00 PM  Result Value Ref Range   Troponin I <0.03 <0.03 ng/mL  CBC with Differential     Status: Abnormal   Collection Time: 10/10/17  9:00 PM  Result Value Ref Range   WBC 7.5 3.6 - 11.0 K/uL   RBC 4.18 3.80 - 5.20 MIL/uL   Hemoglobin 13.3 12.0 - 16.0 g/dL   HCT 38.6 35.0 - 47.0 %   MCV 92.5 80.0 - 100.0 fL   MCH 31.8 26.0 - 34.0 pg   MCHC 34.3 32.0 - 36.0 g/dL   RDW 14.3 11.5 - 14.5 %   Platelets 116 (L) 150 - 440 K/uL   Neutrophils Relative % 75 %   Neutro Abs 5.6 1.4 - 6.5 K/uL   Lymphocytes Relative 19 %   Lymphs Abs 1.4 1.0 - 3.6 K/uL   Monocytes Relative 6 %   Monocytes Absolute 0.4 0.2 - 0.9 K/uL   Eosinophils Relative 0 %   Eosinophils Absolute 0.0 0 - 0.7 K/uL   Basophils Relative 0 %   Basophils Absolute 0.0 0 - 0.1 K/uL  Urinalysis, Complete w Microscopic     Status: Abnormal   Collection Time: 10/11/17 12:40 AM  Result Value Ref Range   Color, Urine YELLOW (A) YELLOW   APPearance HAZY (A) CLEAR   Specific Gravity, Urine 1.021 1.005 - 1.030   pH 5.0 5.0 - 8.0   Glucose, UA >=500 (A) NEGATIVE mg/dL   Hgb urine dipstick SMALL (A) NEGATIVE   Bilirubin Urine NEGATIVE NEGATIVE   Ketones, ur NEGATIVE NEGATIVE mg/dL   Protein, ur 30 (A) NEGATIVE mg/dL   Nitrite NEGATIVE NEGATIVE   Leukocytes, UA NEGATIVE NEGATIVE   RBC / HPF TOO NUMEROUS TO COUNT  0 - 5 RBC/hpf   WBC, UA 6-30 0 - 5 WBC/hpf   Bacteria, UA NONE SEEN NONE SEEN   Squamous Epithelial / LPF 0-5 (A) NONE SEEN   Budding Yeast PRESENT   Glucose, capillary     Status: Abnormal   Collection Time: 10/11/17  1:53 AM  Result Value Ref Range   Glucose-Capillary 246 (H) 65 - 99 mg/dL  Basic metabolic panel     Status: Abnormal   Collection Time: 10/11/17  5:56 AM  Result Value Ref Range   Sodium 137 135 - 145 mmol/L   Potassium 4.1 3.5 - 5.1 mmol/L   Chloride 103 101 - 111 mmol/L   CO2 26 22 - 32 mmol/L   Glucose, Bld 280 (H) 65 - 99 mg/dL   BUN 28 (H) 6 - 20 mg/dL   Creatinine, Ser 1.04 (H) 0.44 - 1.00 mg/dL   Calcium 9.0 8.9 - 10.3 mg/dL   GFR calc non Af Amer 50 (L) >60 mL/min   GFR calc Af Amer 59 (L) >60 mL/min    Comment: (NOTE) The eGFR has been calculated using the CKD EPI equation. This calculation has not been validated in all clinical situations. eGFR's persistently <60 mL/min signify possible Chronic Kidney Disease.    Anion gap 8 5 - 15  CBC     Status: Abnormal   Collection Time: 10/11/17  5:56 AM  Result Value Ref Range   WBC 6.2 3.6 - 11.0 K/uL   RBC 4.01 3.80 - 5.20 MIL/uL   Hemoglobin 12.9 12.0 - 16.0 g/dL   HCT 37.6 35.0 - 47.0 %   MCV 93.7 80.0 - 100.0 fL   MCH 32.0 26.0 - 34.0 pg   MCHC  34.2 32.0 - 36.0 g/dL   RDW 14.7 (H) 11.5 - 14.5 %   Platelets 93 (L) 150 - 440 K/uL  Glucose, capillary     Status: Abnormal   Collection Time: 10/11/17  8:09 AM  Result Value Ref Range   Glucose-Capillary 151 (H) 65 - 99 mg/dL  Glucose, capillary     Status: Abnormal   Collection Time: 10/11/17 11:52 AM  Result Value Ref Range   Glucose-Capillary 234 (H) 65 - 99 mg/dL  Glucose, capillary     Status: Abnormal   Collection Time: 10/11/17  4:49 PM  Result Value Ref Range   Glucose-Capillary 177 (H) 65 - 99 mg/dL    Current Facility-Administered Medications  Medication Dose Route Frequency Provider Last Rate Last Dose  . acetaminophen (TYLENOL) tablet  650 mg  650 mg Oral Q6H PRN Lance Coon, MD       Or  . acetaminophen (TYLENOL) suppository 650 mg  650 mg Rectal Q6H PRN Lance Coon, MD      . clopidogrel (PLAVIX) tablet 75 mg  75 mg Oral Daily Lance Coon, MD   75 mg at 10/11/17 0916  . enoxaparin (LOVENOX) injection 40 mg  40 mg Subcutaneous Q24H Lance Coon, MD      . insulin aspart (novoLOG) injection 0-5 Units  0-5 Units Subcutaneous QHS Lance Coon, MD   3 Units at 10/11/17 0200  . insulin aspart (novoLOG) injection 0-9 Units  0-9 Units Subcutaneous TID WC Lance Coon, MD   3 Units at 10/11/17 1217  . metoprolol tartrate (LOPRESSOR) tablet 25 mg  25 mg Oral BID Epifanio Lesches, MD   25 mg at 10/11/17 1219  . midodrine (PROAMATINE) tablet 10 mg  10 mg Oral TID WC Lance Coon, MD   10 mg at 10/11/17 1218  . OLANZapine (ZYPREXA) tablet 2.5 mg  2.5 mg Oral QHS Epifanio Lesches, MD      . ondansetron [facility]) tablet 4 mg  4 mg Oral Q6H PRN Lance Coon, MD       Or  . ondansetron [facility]) injection 4 mg  4 mg Intravenous Q6H PRN Lance Coon, MD      . pravastatin (PRAVACHOL) tablet 80 mg  80 mg Oral Daily Lance Coon, MD   80 mg at 10/11/17 0915  . QUEtiapine (SEROQUEL) tablet 12.5 mg  12.5 mg Oral Q6H PRN Lance Coon, MD      . sodium chloride flush (NS) 0.9 % injection 3 mL  3 mL Intravenous Q12H Epifanio Lesches, MD        Musculoskeletal: Strength & Muscle Tone: within normal limits Gait & Station: normal Patient leans: N/A  Psychiatric Specialty Exam: Physical Exam  Nursing note and vitals reviewed. Constitutional: She appears well-developed and well-nourished.  HENT:  Head: Normocephalic and atraumatic.  Eyes: Conjunctivae are normal. Pupils are equal, round, and reactive to light.  Neck: Normal range of motion.  Respiratory: Effort normal. No respiratory distress.  GI: Soft.  Musculoskeletal: Normal range of motion.  Neurological: She is alert.  Skin: Skin is warm and dry.  Psychiatric:  Her affect is blunt. Her speech is tangential. She is agitated and slowed. She is not aggressive. Thought content is paranoid and delusional. Cognition and memory are impaired. She expresses impulsivity and inappropriate judgment. She expresses no homicidal and no suicidal ideation. She exhibits abnormal recent memory and abnormal remote memory.    Review of Systems  Constitutional: Negative.   HENT: Negative.   Eyes: Negative.   Respiratory:  Negative.   Cardiovascular: Negative.   Gastrointestinal: Negative.   Musculoskeletal: Negative.   Skin: Negative.   Neurological: Negative.   Psychiatric/Behavioral: Positive for memory loss. Negative for depression, hallucinations, substance abuse and suicidal ideas. The patient has insomnia. The patient is not nervous/anxious.     Blood pressure 140/60, pulse 72, temperature 97.7 F (36.5 C), resp. rate 18, height '5\' 7"'  (1.702 m), weight 76.2 kg (168 lb), SpO2 100 %.Body mass index is 26.31 kg/m.  General Appearance: Disheveled  Eye Contact:  Minimal  Speech:  Slow  Volume:  Decreased  Mood:  Euthymic  Affect:  Constricted and Inappropriate  Thought Process:  Disorganized  Orientation:  Negative  Thought Content:  Illogical, Delusions, Hallucinations: Visual, Paranoid Ideation, Rumination and Tangential  Suicidal Thoughts:  No  Homicidal Thoughts:  No  Memory:  Immediate;   Fair Recent;   Poor Remote;   Poor  Judgement:  Impaired  Insight:  Lacking  Psychomotor Activity:  Restlessness  Concentration:  Concentration: Poor  Recall:  Poor  Fund of Knowledge:  Fair  Language:  Fair  Akathisia:  No  Handed:  Right  AIMS (if indicated):     Assets:  Social Support  ADL's:  Impaired  Cognition:  Impaired,  Moderate  Sleep:        Treatment Plan Summary: Daily contact with patient to assess and evaluate symptoms and progress in treatment, Medication management and Plan The patient is very demented and clearly at this point has no  capacity to make any kind of reasonable decisions.  She lacks even the most basic understanding of her current situation.  She is not able to retain information even for a minute.  Patient is clearly unable to care for herself.  Guardianship would be extremely appropriate as would placement in a memory care or similar facility with control on her to keep her from [place] or becoming agitated.  As far as treatment I will increase her evening olanzapine for the agitation.  I will follow up as needed.  Case reviewed with nursing and with case management.  Disposition: Patient does not meet criteria for psychiatric inpatient admission. Supportive therapy provided about ongoing stressors.  Alethia Berthold, MD 10/11/2017 5:54 PM

## 2017-10-11 NOTE — Progress Notes (Signed)
Patient had a unwitnessed fall.  She had been sitting in a chair in the hallway outside of her room and was found on the floor.  She thinks she fell out of the chair. Vitals signs are WNL.  Neuro check is unremarkable. She says her shoulder hurts, an x-ray has been ordered. Dr. Imogene Burnhen was notified.  Her daughter, Jetty PeeksLisa Alesi was notified.  [name] said she falls a lot at home and says she c/o shoulder pain after the last fall.  CT showed bruising only.

## 2017-10-11 NOTE — Care Management Obs Status (Addendum)
MEDICARE OBSERVATION STATUS NOTIFICATION   Patient Details  Name: Meghan Lloyd MRN: [id number] Date of Birth: 26-May-1941   Medicare Observation Status Notification Given:  Yes Patient could not electronically sign.  Patient and her hcpoa signed the notice. Notice signed, one given to patient and the other to HIM for scanning   Eber HongGreene, Bayle Calvo R, RN 10/11/2017, 2:36 PM

## 2017-10-11 NOTE — Care Management Note (Signed)
Case Management Note  Patient Details  Name: Cannon KettleDorothy Quijas MRN: [id number] Date of Birth: 03-Sep-1941  Subjective/Objective:                 Placed in observation for syncope thought to be due to atrial fib and orthostatic hypotension.  both are chronic and patient is on Midodrine for her blood pressure. Lives with her daughter [name].  [place]  Has cane and walker but does not use. [facility] and was to [facility] but does not know the name of the physician. [facility] at last discharge for physical therapy and aide.  Action/Plan: Notified [facility] that patient has been placed in observation.  Add nursing to home health referral  Expected Discharge Date:                  Expected Discharge Plan:     In-House Referral:     Discharge planning Services     Post Acute Care Choice:    Choice offered to:     DME Arranged:    DME Agency:     HH Arranged:    HH Agency:     Status of Service:     If discussed at [employer] of [employer] Meetings, dates discussed:    Additional Comments:  Eber HongGreene, Aspyn Warnke R, RN 10/11/2017, 8:50 AM

## 2017-10-11 NOTE — Progress Notes (Signed)
Pt will be transferring to 1A, Pt is at no distress.

## 2017-10-12 DIAGNOSIS — F0391 Unspecified dementia with behavioral disturbance: Secondary | ICD-10-CM | POA: Diagnosis not present

## 2017-10-12 DIAGNOSIS — I4891 Unspecified atrial fibrillation: Secondary | ICD-10-CM | POA: Diagnosis not present

## 2017-10-12 LAB — GLUCOSE, CAPILLARY
GLUCOSE-CAPILLARY: 150 mg/dL — AB (ref 65–99)
Glucose-Capillary: 160 mg/dL — ABNORMAL HIGH (ref 65–99)
Glucose-Capillary: 229 mg/dL — ABNORMAL HIGH (ref 65–99)

## 2017-10-12 MED ORDER — DILTIAZEM HCL 30 MG PO TABS: 30.0000 mg | ORAL_TABLET | Freq: Three times a day (TID) | ORAL | 11 refills | Status: DC

## 2017-10-12 MED ORDER — OLANZAPINE 5 MG PO TABS: 5.0000 mg | ORAL_TABLET | Freq: Every day | ORAL | 0 refills | Status: DC

## 2017-10-12 MED ORDER — DILTIAZEM HCL 30 MG PO TABS: 30.0000 mg | ORAL_TABLET | Freq: Three times a day (TID) | ORAL | 0 refills | Status: DC

## 2017-10-12 MED ORDER — QUETIAPINE FUMARATE 25 MG PO TABS: 12.5000 mg | ORAL_TABLET | Freq: Four times a day (QID) | ORAL | 0 refills | Status: DC | PRN

## 2017-10-12 NOTE — Clinical Social Work Note (Signed)
Clinical Social Work Assessment  Patient Details  Name: Meghan Lloyd MRN: [id number] Date of Birth: 06-30-1941  Date of referral:  10/12/17               Reason for consult:  Guardianship Needs, Discharge Planning                Permission sought to share information with:    Permission granted to share information::     Name::        Agency::     Relationship::     Contact Information:     Housing/Transportation Living arrangements for the past 2 months:  Single Family Home Source of Information:  Patient, Adult Children, Power of Attorney Patient Interpreter Needed:  None Criminal Activity/Legal Involvement Pertinent to Current Situation/Hospitalization:  No - Comment as needed Significant Relationships:  Adult Children Lives with:  Adult Children Do you feel safe going back to the place where you live?  Yes Need for family participation in patient care:  Yes (Comment)  Care giving concerns:  [place] lives in [place] with her step son and step daughter in law Fish farm managerLisa Lloyd.    Social Worker assessment / plan:  Visual merchandiserClinical Social Worker (CSW) received verbal consult from MD regarding patient's capacity to make decisions. Per Psych MD patient does not have capacity to make decisions for herself. Per MD patient is medically stable for D/C today. CSW contacted patient's step daughter in law Meghan Lloyd cell # [phone], home # [phone]. Per Meghan Lloyd she has POA over patient. Meghan Lloyd reported that [place] lived [place] alone and was placed in a SNF for rehab and used her 20 days. Per Meghan Lloyd the rehab asked Meghan Lloyd and her husband to come pick patient up. Per Meghan Lloyd patient [place] since that time. Per Meghan Lloyd patient makes too much money to [place] for medicaid around $2,800 a month and still owns her home in [place] and a car in [place]. Per Meghan Lloyd they pay patient's bills with that money and she doesn't have any money to pay privately for patient to be  placed in a facility. CSW explained that patient is under medicare observation, which means medicare will not pay for SNF. CSW also explained that patient doesn't have a payer for placement at this time. Per Meghan Lloyd patient patient can come home to her house temporarily until placement can be secured with the help of DSS. Per Meghan Lloyd she doesn't want to be patient's guardian and she wants her to be a ward of the state. CSW explained to Meghan Lloyd that she needs to go to [facility] [place] and make them aware of above. Per Meghan Lloyd she will go to DSS first thing Monday morning. Per Meghan Lloyd patient doesn't have access to a car or keys. Per Meghan Lloyd patient has bells on her door and has basically "child proofed" her home. Per Meghan Lloyd patient was about to [facility].   Per RN case manager MD is going to D/C patient home today. RN case manager arranged for home health PT, RN and Child psychotherapistsocial worker with [facility]. CSW made an Adult Management consultantrotective Services (APS) report and asked for them to pursue guardianship. RN aware of above. Please reconsult if future social work needs arise. CSW signing off.    Employment status:  Disabled (Comment on whether or not currently receiving Disability), Retired Health and safety inspectornsurance information:  Medicare PT Recommendations:  Not assessed at this time Information / Referral to  community resources:  [place] ([place] Required: [place], Name & Number of worker spoken with)([place] in [place]. )  Patient/Family's Response to care:  Patient's POA Meghan Lloyd is agreeable to take patient home today.   Patient/Family's Understanding of and Emotional Response to Diagnosis, Current Treatment, and Prognosis:  Patient's POA Meghan Lloyd was pleasant and thanked CSW for assistance.   Emotional Assessment Appearance:  Appears stated age Attitude/Demeanor/Rapport:    Affect (typically observed):  Pleasant Orientation:  Oriented to Self, Fluctuating Orientation (Suspected and/or reported Sundowners) Alcohol /  Substance use:  Not Applicable Psych involvement (Current and /or in the community):  No (Comment)  Discharge Needs  Concerns to be addressed:  Discharge Planning Concerns Readmission within the last 30 days:  No Current discharge risk:  Cognitively Impaired Barriers to Discharge:  Continued Medical Work up   [employer], Meghan CrockerBailey M, LCSW 10/12/2017, 1:55 PM

## 2017-10-12 NOTE — Care Management (Signed)
Spoke with Maralyn SagoSarah at [facility]. She has been trying to get in touch with the daughter in law regarding who the patient will have as her pcp but has been unsuccessful. She will continue to attempt to reach daughter in law for further clarification.

## 2017-10-12 NOTE — Evaluation (Signed)
Physical Therapy Evaluation Patient Details Name: Meghan Lloyd MRN: [id number] DOB: Mar 08, 1941 Today's Date: 10/12/2017   History of Present Illness  76 y.o. female who presents with syncopal episodes at home.  Patient presented here to the ED in A. fib with RVR.  Patient has had these episodes, and has been evaluated.  She was here las week with syncopal episode, d/c'd home.  Clinical Impression  Pt did well regarding strength, mobility, ambulation and balance.  She did have episode of dizziness and needed to stop after walking >100 ft and doing the steps w/o AD.  Upon return to her room all her vitals were stable and appropriate with pt feeling much less dizziness, etc.  Pt was safe with all functional tasks and just needed some cuing to stay on task and focus on what she was supposed to be doing.    Follow Up Recommendations No PT follow up    Equipment Recommendations  None recommended by PT    Recommendations for Other Services       Precautions / Restrictions Precautions Precautions: Fall Restrictions Weight Bearing Restrictions: No      Mobility  Bed Mobility Overal bed mobility: Independent             General bed mobility comments: Pt able to rise to sitting w/o issue, no dizziness on sitting up  Transfers Overall transfer level: Independent Equipment used: None Transfers: Sit to/from Stand Sit to Stand: Modified independent (Device/Increase time)         General transfer comment: Pt is able to rise w/o assist, maintained balance well w/o AD  Ambulation/Gait Ambulation/Gait assistance: Supervision Ambulation Distance (Feet): 125 Feet Assistive device: None       General Gait Details: Pt walks with consistent, appropriate cadence and did not have any overt LOBs.  She was very confident until she started to "feel drunk" and needed to stop/sit.    Stairs Stairs: Yes Stairs assistance: Modified independent (Device/Increase time) Stair Management: One  rail Right Number of Stairs: 4 General stair comments: Pt able to go up steps w/o UEs with reciprocal pattern, light UE use descending  Wheelchair Mobility    Modified Rankin (Stroke Patients Only)       Balance Overall balance assessment: Modified Independent                                           Pertinent Vitals/Pain Pain Assessment: No/denies pain(top of her head (dizzy? head ache?))    Home Living Family/patient expects to be discharged to:: Private residence Living Arrangements: Children Available Help at Discharge: Family Type of Home: House Home Access: Stairs to enter Entrance Stairs-Rails: Right Entrance Stairs-Number of Steps: 3 Home Layout: One level Home Equipment: Environmental consultantWalker - 2 wheels;Cane - single point      Prior Function Level of Independence: Independent         Comments: Pt reports Ind amb without AD community distances, Ind with ADLs, no fall history, and an active lifestyle including exercise at a local gym     Hand Dominance        Extremity/Trunk Assessment   Upper Extremity Assessment Upper Extremity Assessment: Overall WFL for tasks assessed    Lower Extremity Assessment Lower Extremity Assessment: Overall WFL for tasks assessed       Communication   Communication: No difficulties  Cognition Arousal/Alertness: Awake/alert Behavior During Therapy: Impulsive Overall  Cognitive Status: History of cognitive impairments - at baseline                                 General Comments: pleasantly confused, less random rambling that last week      General Comments      Exercises     Assessment/Plan    PT Assessment Patent does not need any further PT services  PT Problem List Decreased balance;Decreased knowledge of use of DME;Decreased safety awareness       PT Treatment Interventions      PT Goals (Current goals can be found in the Care Plan section)  Acute Rehab PT Goals Patient Stated  Goal: To return home PT Goal Formulation: All assessment and education complete, DC therapy    Frequency     Barriers to discharge        Co-evaluation               AM-PAC PT "6 Clicks" Daily Activity  Outcome Measure Difficulty turning over in bed (including adjusting bedclothes, sheets and blankets)?: None Difficulty moving from lying on back to sitting on the side of the bed? : None Difficulty sitting down on and standing up from a chair with arms (e.g., wheelchair, bedside commode, etc,.)?: None Help needed moving to and from a bed to chair (including a wheelchair)?: None Help needed walking in hospital room?: None Help needed climbing 3-5 steps with a railing? : None 6 Click Score: 24    End of Session Equipment Utilized During Treatment: Gait belt Activity Tolerance: Patient tolerated treatment well(limited due to dizziness (vitals were fine upon return)) Patient left: with nursing/sitter in room;with bed alarm set;with call bell/phone within reach Nurse Communication: Mobility status PT Visit Diagnosis: Unsteadiness on feet (R26.81);Difficulty in walking, not elsewhere classified (R26.2)    Time: [id number] PT Time Calculation (min) (ACUTE ONLY): 26 min   Charges:         PT G Codes:   PT G-Codes **NOT FOR INPATIENT CLASS** Functional Assessment Tool Used: AM-PAC 6 Clicks Basic Mobility Functional Limitation: Mobility: Walking and moving around Mobility: Walking and Moving Around Current Status ([id number](G8978): 0 percent impaired, limited or restricted Mobility: Walking and Moving Around Goal Status (J1914(G8979): 0 percent impaired, limited or restricted Mobility: Walking and Moving Around Discharge Status [phone](G8980): 0 percent impaired, limited or restricted    Malachi ProGalen R Redmond Lloyd, DPT 10/12/2017, 3:59 PM

## 2017-10-12 NOTE — Progress Notes (Signed)
[facility] [place] [facility]   PATIENT NAME: Meghan KettleDorothy Lloyd    MR#:  [id number]  DATE OF BIRTH:  [birth year]  SUBJECTIVE:a patient admitted for syncope.  She is a very poor historian has dementia and no family at bedside.  According to the charts patient has confusion, dementia wandering around, [facility]  Slightly more alert but still think she is at [facility].  And has sitter at bedside.  Patient is incompetent as per psych.  CHIEF COMPLAINT:   Chief Complaint  Patient presents with  . Fall  . Dizziness    REVIEW OF SYSTEMS:   Review of Systems  Unable to perform ROS: Dementia   DRUG ALLERGIES:  No Known Allergies  VITALS:  Blood pressure (!) 117/53, pulse 71, temperature 98.2 F (36.8 C), temperature source Oral, resp. rate 18, height 5\' 7"  (1.702 m), weight 76.2 kg (168 lb), SpO2 99 %.  PHYSICAL EXAMINATION:  GENERAL:  76 y.o.-year-old patient lying in the bed with no acute distress.  EYES: Pupils equal, round, reactive to light and accommodation. No scleral icterus. Extraocular muscles intact.  HEENT: Head atraumatic, normocephalic. Oropharynx and nasopharynx clear.  NECK:  Supple, no jugular venous distention. No thyroid enlargement, no tenderness.  LUNGS: Normal breath sounds bilaterally, no wheezing, rales,rhonchi or crepitation. No use of accessory muscles of respiration.  CARDIOVASCULAR: S1, S2 normal. No murmurs, rubs, or gallops.  ABDOMEN: Soft, nontender, nondistended. Bowel sounds present. No organomegaly or mass.  EXTREMITIES: No pedal edema, cyanosis, or clubbing.  NEUROLOGIC: Patient demented, unable to do full neurological exam because of dementia PSYCHIATRIC: The patient is awake but disoriented due to dementia [facility]. SKIN: No obvious rash, lesion, or ulcer.    LABORATORY PANEL:   CBC Recent Labs  Lab 10/11/17 0556  WBC 6.2  HGB 12.9  HCT 37.6  PLT 93*    ------------------------------------------------------------------------------------------------------------------  Chemistries  Recent Labs  Lab 10/10/17 1150  10/11/17 0556  NA 136   < > 137  K 4.4   < > 4.1  CL 103   < > 103  CO2 26   < > 26  GLUCOSE 251*   < > 280*  BUN 29*   < > 28*  CREATININE 1.17*   < > 1.04*  CALCIUM 8.9   < > 9.0  AST 21  --   --   ALT 21  --   --   ALKPHOS 60  --   --   BILITOT 1.0  --   --    < > = values in this interval not displayed.   ------------------------------------------------------------------------------------------------------------------  Cardiac Enzymes Recent Labs  Lab 10/10/17 2100  TROPONINI <0.03   ------------------------------------------------------------------------------------------------------------------  RADIOLOGY:  Dg Shoulder Right  Result Date: 10/11/2017 CLINICAL DATA:  Right shoulder pain after falling again today EXAM: RIGHT SHOULDER - 2+ VIEW COMPARISON:  10/10/2017 FINDINGS: Negative for acute fracture or dislocation. Mild AC degenerative changes. Visible portions of the right ribs are intact. IMPRESSION: Negative. Electronically Signed   By: Ellery Plunkaniel R Mitchell M.D.   On: 10/11/2017 22:33   Dg Shoulder Right  Result Date: 10/10/2017 CLINICAL DATA:  Right shoulder pain after multiple falls. EXAM: RIGHT SHOULDER - 2+ VIEW COMPARISON:  None. FINDINGS: There is no evidence of fracture or dislocation. 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 Soft tissues calcifications adjacent to the superior portion of the glenoid likely represent calcific  tendinitis. IMPRESSION: No acute fracture or dislocation identified about the right shoulder. Electronically Signed   By: Ted Mcalpineobrinka  Dimitrova M.D.   On: 10/10/2017 18:53   Ct Head Wo Contrast  Result Date: 10/10/2017 CLINICAL DATA:  Status post 2 falls today. EXAM: CT HEAD WITHOUT CONTRAST CT CERVICAL SPINE WITHOUT CONTRAST TECHNIQUE: Multidetector CT  imaging of the head and cervical spine was performed following the standard protocol without intravenous contrast. Multiplanar CT image reconstructions of the cervical spine were also generated. COMPARISON:  10/10/2017 FINDINGS: CT HEAD FINDINGS Brain: No evidence of acute infarction, hemorrhage, hydrocephalus, extra-axial collection or mass lesion/mass effect. Mild brain parenchymal volume loss and periventricular microangiopathy. Vascular: Calcific atherosclerotic disease of intra cavernous internal carotid arteries. Skull: Normal. Negative for fracture or focal lesion. Sinuses/Orbits: Dense opacification of the left maxillary sinus with high density material with periosteal thickening in the sinus walls, evidence of chronic sinusitis. Other: Left parietal scalp hematoma. CT CERVICAL SPINE FINDINGS Alignment: Normal. Skull base and vertebrae: No acute fracture. No primary bone lesion or focal pathologic process. Soft tissues and spinal canal: No prevertebral fluid or swelling. No visible canal hematoma. Disc levels: Multilevel osteoarthritic changes, with prominent posterior facet arthropathy. Upper chest: Right apical subpleural scarring. 7 mm nodular area in the right apex may represent scarring versus a pulmonary nodule, image 83/95, sequence 6. Other: None. IMPRESSION: No acute intracranial abnormality. No evidence of acute traumatic injury to the cervical spine. Chronic left maxillary sinusitis. 7 mm pulmonary nodule versus pleuroparenchymal scarring in the right lung apex. Non-contrast chest CT at 6-12 months is recommended. If the nodule is stable at time of repeat CT, then future CT at 18-24 months (from today's scan) is considered optional for low-risk patients, but is recommended for high-risk patients. This recommendation follows the consensus statement: Guidelines for Management of Incidental Pulmonary Nodules Detected on CT Images: From the Fleischner Society 2017; Radiology 2017; 284:228-243.  Electronically Signed   By: Ted Mcalpineobrinka  Dimitrova M.D.   On: 10/10/2017 19:04   Ct Head Wo Contrast  Result Date: 10/10/2017 CLINICAL DATA:  Dizziness.  Possible head injury. EXAM: CT HEAD WITHOUT CONTRAST TECHNIQUE: Contiguous axial images were obtained from the base of the skull through the vertex without intravenous contrast. COMPARISON:  CT scan of September 30, 2017. FINDINGS: Brain: Mild diffuse cortical atrophy is noted. Mild chronic ischemic white matter disease is noted. No mass effect or midline shift is noted. Ventricular size is within normal limits. There is no evidence of mass lesion, hemorrhage or acute infarction. Vascular: No hyperdense vessel or unexpected calcification. Skull: Normal. Negative for fracture or focal lesion. Sinuses/Orbits: No acute finding. Other: Small left parietal scalp hematoma is noted. IMPRESSION: Small left parietal scalp hematoma. Mild diffuse cortical atrophy. Mild chronic ischemic white matter disease. No acute intracranial abnormality seen. Electronically Signed   By: Lupita RaiderJames  Green Jr, M.D.   On: 10/10/2017 12:32   Ct Cervical Spine Wo Contrast  Result Date: 10/10/2017 CLINICAL DATA:  Status post 2 falls today. EXAM: CT HEAD WITHOUT CONTRAST CT CERVICAL SPINE WITHOUT CONTRAST TECHNIQUE: Multidetector CT imaging of the head and cervical spine was performed following the standard protocol without intravenous contrast. Multiplanar CT image reconstructions of the cervical spine were also generated. COMPARISON:  10/10/2017 FINDINGS: CT HEAD FINDINGS Brain: No evidence of acute infarction, hemorrhage, hydrocephalus, extra-axial collection or mass lesion/mass effect. Mild brain parenchymal volume loss and periventricular microangiopathy. Vascular: Calcific atherosclerotic disease of intra cavernous internal carotid arteries. Skull: Normal. Negative for fracture or focal  lesion. Sinuses/Orbits: Dense opacification of the left maxillary sinus with high density material with  periosteal thickening in the sinus walls, evidence of chronic sinusitis. Other: Left parietal scalp hematoma. CT CERVICAL SPINE FINDINGS Alignment: Normal. Skull base and vertebrae: No acute fracture. No primary bone lesion or focal pathologic process. Soft tissues and spinal canal: No prevertebral fluid or swelling. No visible canal hematoma. Disc levels: Multilevel osteoarthritic changes, with prominent posterior facet arthropathy. Upper chest: Right apical subpleural scarring. 7 mm nodular area in the right apex may represent scarring versus a pulmonary nodule, image 83/95, sequence 6. Other: None. IMPRESSION: No acute intracranial abnormality. No evidence of acute traumatic injury to the cervical spine. Chronic left maxillary sinusitis. 7 mm pulmonary nodule versus pleuroparenchymal scarring in the right lung apex. Non-contrast chest CT at 6-12 months is recommended. If the nodule is stable at time of repeat CT, then future CT at 18-24 months (from today's scan) is considered optional for low-risk patients, but is recommended for high-risk patients. This recommendation follows the consensus statement: Guidelines for Management of Incidental Pulmonary Nodules Detected on CT Images: From the Fleischner Society 2017; Radiology 2017; 284:228-243. Electronically Signed   By: Ted Mcalpine M.D.   On: 10/10/2017 19:04    EKG:   Orders placed or performed during the hospital encounter of 10/10/17  . EKG 12-Lead  . EKG 12-Lead    ASSESSMENT AND PLAN:  #1 .  Syncope secondary to atrial fibrillation with RVR: Medication noncompliance with her dementia may not be taking metoprolol and Cardizem as she should.  Patient heart rate is stable because she is getting metoprolol in the hospital.  If she goes home today she will be on metoprolol, to decrease dose of Cardizem.  Waiting for physical therapy evaluation, guardianship takes 4 weeks as per case manager, family wants guardianship from  state.   2.persistent afib; rate controlled, bblocker;     Confusion and dementia olanzapine dose is increased for agitation.  Seen by psych, patient clearly lacks the capacity to make any kind of reasonable decision.  5. high risk for readmission, reviewed case management note. #3. history of dementia, medical history: Patient is on Seroquel, Risperdal. 4.  Dementia with noncompliance with medicines.  All the records are reviewed and case discussed with Care Management/Social Workerr. Management plans discussed with the patient, family and they are in agreement.  CODE STATUS: full  TOTAL TIME TAKING CARE OF THIS PATIENT: 35 minutes.   POSSIBLE D/C IN 1-2 DAYS, DEPENDING ON CLINICAL CONDITION.   Katha Hamming M.D on 10/12/2017 at 11:38 AM  Between 7am to 6pm - Pager - [phone]  After 6pm go to [email or web address] - password [email or web address]  [facility]  Office  [phone]  CC: Primary care physician; Raynelle Bring   Note: This dictation was prepared with Dragon dictation along with smaller phrase technology. Any transcriptional errors that result from this process are unintentional.

## 2017-10-12 NOTE — Progress Notes (Signed)
[place]. Patient alert to self. Discharge instructions and med details reviewed with daughter in law. All questions addressed.  Printed AVS given to pt. IV removed. Patient escorted out via wheelchair.

## 2017-10-12 NOTE — Care Management Note (Addendum)
Case Management Note  Patient Details  Name: Meghan Lloyd MRN: [id number] Date of Birth: [birth year]  Subjective/Objective:   CSW has spoke with daughter in law, [name]. She is agreeable to take patient back home with home health. Dgt in law plans to seek guardianship and make patient a ward of the state.  Case dicussed with attending. Will need RN and SW. [facility] Patient to discharge home today. CSW making APS referral for guardianship.                 [facility] and [facility]  Expected Discharge Date:                  Expected Discharge Plan:  Home w Home Health Services  In-House Referral:     Discharge planning Services  CM Consult  Post Acute Care Choice:  [facility] Health [facility]  [facility]  DME Arranged:    DME Agency:     HH Arranged:  RN, Social Work [facility]  Status of Service:  Completed, signed off  If discussed at [employer] Meetings, dates discussed:    Additional Comments:  Marily MemosLisa M [name], RN 10/12/2017, 1:53 PM

## 2017-10-12 NOTE — Progress Notes (Signed)
Progress Note  Patient Name: Meghan Lloyd Date of Encounter: 10/12/2017  Primary Cardiologist: Julien Nordmann, MD  Subjective   No chest pain, sob, palps, presyncope/syncope.  Inpatient Medications    Scheduled Meds: . clopidogrel  75 mg Oral Daily  . enoxaparin (LOVENOX) injection  40 mg Subcutaneous Q24H  . insulin aspart  0-5 Units Subcutaneous QHS  . insulin aspart  0-9 Units Subcutaneous TID WC  . metoprolol tartrate  25 mg Oral BID  . midodrine  10 mg Oral TID WC  . OLANZapine  5 mg Oral QHS  . pravastatin  80 mg Oral Daily  . sodium chloride flush  3 mL Intravenous Q12H   Continuous Infusions:  PRN Meds: acetaminophen **OR** acetaminophen, ondansetron **OR** ondansetron (ZOFRAN) IV, QUEtiapine   Vital Signs    Vitals:   10/11/17 2107 10/11/17 2302 10/12/17 0355 10/12/17 0831  BP: 139/75 (!) 136/58 (!) 119/59 (!) 117/53  Pulse: 83 80 74 71  Resp: Temp: 97.7 F (36.5 C) 97.7 F (36.5 C) 98 F (36.7 C) 98.2 F (36.8 C)  TempSrc: Oral Axillary Oral Oral  SpO2: 100% 99% 97% 99%  Weight:      Height:        Intake/Output Summary (Last 24 hours) at 10/12/2017 1125 Last data filed at 10/11/2017 1903 Gross per 24 hour  Intake 480 ml  Output 200 ml  Net 280 ml   Filed Weights   10/10/17 1603 10/10/17 2326  Weight: 165 lb (74.8 kg) 168 lb (76.2 kg)    Physical Exam   GEN: Well nourished, well developed, in no acute distress.  HEENT: Grossly normal.  Neck: Supple, no JVD, carotid bruits, or masses. Cardiac: IR, IR, no murmurs, rubs, or gallops. No clubbing, cyanosis, edema.  Radials/DP/PT 2+ and equal bilaterally.  Respiratory:  Respirations regular and unlabored, clear to auscultation bilaterally. GI: Soft, nontender, nondistended, BS + x 4. MS: no deformity or atrophy. Skin: warm and dry, no rash. Neuro:  Strength and sensation are intact. Psych: AAOx3.  More lucid this am.  Less distracted.  Normal affect.  Labs     Chemistry Recent Labs  Lab 10/10/17 1150 10/10/17 2100 10/11/17 0556  NA 136 138 137  K 4.4 4.4 4.1  CL 103 104 103  CO2 GLUCOSE 251* 261* 280*  BUN 29* 30* 28*  CREATININE 1.17* 1.08* 1.04*  CALCIUM 8.9 9.1 9.0  PROT 6.7  --   --   ALBUMIN 3.9  --   --   AST 21  --   --   ALT 21  --   --   ALKPHOS 60  --   --   BILITOT 1.0  --   --   GFRNONAA 44* 48* 50*  GFRAA 51* 56* 59*  ANIONGAP Hematology Recent Labs  Lab 10/10/17 1150 10/10/17 2100 10/11/17 0556  WBC 6.7 7.5 6.2  RBC 4.28 4.18 4.01  HGB 13.7 13.3 12.9  HCT 40.2 38.6 37.6  MCV 93.8 92.5 93.7  MCH 32.0 31.8 32.0  MCHC 34.1 34.3 34.2  RDW 14.4 14.3 14.7*  PLT 98* 116* 93*    Cardiac Enzymes Recent Labs  Lab 10/10/17 2100  TROPONINI <0.03      Radiology    Dg Shoulder Right  Result Date: 10/11/2017 CLINICAL DATA:  Right shoulder pain after falling again today EXAM: RIGHT SHOULDER - 2+ VIEW COMPARISON:  10/10/2017 FINDINGS: Negative  for acute fracture or dislocation. Mild AC degenerative changes. Visible portions of the right ribs are intact. IMPRESSION: Negative. Electronically Signed   By: Ellery Plunk M.D.   On: 10/11/2017 22:33   Dg Shoulder Right  Result Date: 10/10/2017 CLINICAL DATA:  Right shoulder pain after multiple falls. EXAM: RIGHT SHOULDER - 2+ VIEW COMPARISON:  None. FINDINGS: There is no evidence of fracture or dislocation. 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 Soft tissues calcifications adjacent to the superior portion of the glenoid likely represent calcific tendinitis. IMPRESSION: No acute fracture or dislocation identified about the right shoulder. Electronically Signed   By: Ted Mcalpine M.D.   On: 10/10/2017 18:53   Ct Head Wo Contrast  Result Date: 10/10/2017 CLINICAL DATA:  Status post 2 falls today. EXAM: CT HEAD WITHOUT CONTRAST CT CERVICAL SPINE WITHOUT CONTRAST TECHNIQUE: Multidetector CT imaging of the head and  cervical spine was performed following the standard protocol without intravenous contrast. Multiplanar CT image reconstructions of the cervical spine were also generated. COMPARISON:  10/10/2017 FINDINGS: CT HEAD FINDINGS Brain: No evidence of acute infarction, hemorrhage, hydrocephalus, extra-axial collection or mass lesion/mass effect. Mild brain parenchymal volume loss and periventricular microangiopathy. Vascular: Calcific atherosclerotic disease of intra cavernous internal carotid arteries. Skull: Normal. Negative for fracture or focal lesion. Sinuses/Orbits: Dense opacification of the left maxillary sinus with high density material with periosteal thickening in the sinus walls, evidence of chronic sinusitis. Other: Left parietal scalp hematoma. CT CERVICAL SPINE FINDINGS Alignment: Normal. Skull base and vertebrae: No acute fracture. No primary bone lesion or focal pathologic process. Soft tissues and spinal canal: No prevertebral fluid or swelling. No visible canal hematoma. Disc levels: Multilevel osteoarthritic changes, with prominent posterior facet arthropathy. Upper chest: Right apical subpleural scarring. 7 mm nodular area in the right apex may represent scarring versus a pulmonary nodule, image 83/95, sequence 6. Other: None. IMPRESSION: No acute intracranial abnormality. No evidence of acute traumatic injury to the cervical spine. Chronic left maxillary sinusitis. 7 mm pulmonary nodule versus pleuroparenchymal scarring in the right lung apex. Non-contrast chest CT at 6-12 months is recommended. If the nodule is stable at time of repeat CT, then future CT at 18-24 months (from today's scan) is considered optional for low-risk patients, but is recommended for high-risk patients. This recommendation follows the consensus statement: Guidelines for Management of Incidental Pulmonary Nodules Detected on CT Images: From the Fleischner Society 2017; Radiology 2017; 284:228-243. Electronically Signed   By:  Ted Mcalpine M.D.   On: 10/10/2017 19:04   Ct Head Wo Contrast  Result Date: 10/10/2017 CLINICAL DATA:  Dizziness.  Possible head injury. EXAM: CT HEAD WITHOUT CONTRAST TECHNIQUE: Contiguous axial images were obtained from the base of the skull through the vertex without intravenous contrast. COMPARISON:  CT scan of September 30, 2017. FINDINGS: Brain: Mild diffuse cortical atrophy is noted. Mild chronic ischemic white matter disease is noted. No mass effect or midline shift is noted. Ventricular size is within normal limits. There is no evidence of mass lesion, hemorrhage or acute infarction. Vascular: No hyperdense vessel or unexpected calcification. Skull: Normal. Negative for fracture or focal lesion. Sinuses/Orbits: No acute finding. Other: Small left parietal scalp hematoma is noted. IMPRESSION: Small left parietal scalp hematoma. Mild diffuse cortical atrophy. Mild chronic ischemic white matter disease. No acute intracranial abnormality seen. Electronically Signed   By: Lupita Raider, M.D.   On: 10/10/2017 12:32   Ct Cervical Spine Wo Contrast  Result Date: 10/10/2017 CLINICAL DATA:  Status  post 2 falls today. EXAM: CT HEAD WITHOUT CONTRAST CT CERVICAL SPINE WITHOUT CONTRAST TECHNIQUE: Multidetector CT imaging of the head and cervical spine was performed following the standard protocol without intravenous contrast. Multiplanar CT image reconstructions of the cervical spine were also generated. COMPARISON:  10/10/2017 FINDINGS: CT HEAD FINDINGS Brain: No evidence of acute infarction, hemorrhage, hydrocephalus, extra-axial collection or mass lesion/mass effect. Mild brain parenchymal volume loss and periventricular microangiopathy. Vascular: Calcific atherosclerotic disease of intra cavernous internal carotid arteries. Skull: Normal. Negative for fracture or focal lesion. Sinuses/Orbits: Dense opacification of the left maxillary sinus with high density material with periosteal thickening in the  sinus walls, evidence of chronic sinusitis. Other: Left parietal scalp hematoma. CT CERVICAL SPINE FINDINGS Alignment: Normal. Skull base and vertebrae: No acute fracture. No primary bone lesion or focal pathologic process. Soft tissues and spinal canal: No prevertebral fluid or swelling. No visible canal hematoma. Disc levels: Multilevel osteoarthritic changes, with prominent posterior facet arthropathy. Upper chest: Right apical subpleural scarring. 7 mm nodular area in the right apex may represent scarring versus a pulmonary nodule, image 83/95, sequence 6. Other: None. IMPRESSION: No acute intracranial abnormality. No evidence of acute traumatic injury to the cervical spine. Chronic left maxillary sinusitis. 7 mm pulmonary nodule versus pleuroparenchymal scarring in the right lung apex. Non-contrast chest CT at 6-12 months is recommended. If the nodule is stable at time of repeat CT, then future CT at 18-24 months (from today's scan) is considered optional for low-risk patients, but is recommended for high-risk patients. This recommendation follows the consensus statement: Guidelines for Management of Incidental Pulmonary Nodules Detected on CT Images: From the Fleischner Society 2017; Radiology 2017; 284:228-243. Electronically Signed   By: Ted Mcalpineobrinka  Dimitrova M.D.   On: 10/10/2017 19:04    Telemetry    Afib, 50's to 60's.  Up to 2.25 sec pauses. - Personally Reviewed   09/2017 Echo: Ef 50-55%, no rwma, mild AI/MR, mod dil LA/RA, mod TR, PASP 38mmHg; c. 09/2017 Carotid U/S: <50% bilat ICA stenosis.  Patient Profile     76 y.o. female with a history of Afib, dementia, syncope, orthostatic hypotension, HTN, and dementia, who was readmitted 12/12 with syncope and rapid afib.  Assessment & Plan    1.  Syncope: admitted w/ recurrent syncope on 12/12 in setting of known orthostatic hypotension during admission last week.  Also in rapid afib on arrival.  Circumstances following syncope unclear as pt is  not reliable historian.  Orthostatics not checked yesterday.  Suspect noncompliance with meds @ home as she is now stable on midodrine and  blocker.  I do have concern for potential of symptomatic bradycardia as she has had pauses up to 2.25 seconds on tele, but nothing > 3 seconds.  She would benefit from outpt monitoring, but given dementia, it's not clear that she would wear it without constant reminders.  2.  Persistent Afib: unknown duration.  Rate controlled on  blocker. CHA2DS2VASc = 5-6, however, she is a poor candidate for OAC in setting of dementia and falls.  3.  Orthostatic Hypotension: Full orthostatics not performed - ordered yesterday.  Cont midodrine.  Signed, Nicolasa Duckinghristopher Treylen Gibbs, NP  10/12/2017, 11:25 AM    For questions or updates, please contact   Please consult [email or web address] for contact info under Cardiology/STEMI.

## 2017-10-15 NOTE — [place] FL2 (Signed)
  [place] MEDICAID FL2 LEVEL OF CARE SCREENING TOOL     IDENTIFICATION  Patient Name: Meghan Lloyd Birthdate: 01-Jun-1941 Sex: female Admission Date (Current Location): 10/10/2017  [place]   Facility and Address:  [facility], [address], [place], [place] [address]      Provider Number: [id number]  Attending Physician Name and Address:  No att. providers found  Relative Name and Phone Number:       Current Level of Care: Other (Comment)(At home in private residence. ) Recommended Level of Care: Assisted Living Facility, Memory Care Prior Approval Number:    Date Approved/Denied:   PASRR Number:    Discharge Plan: Domiciliary (Rest home)(Memory Care)    Current Diagnoses: Patient Active Problem List   Diagnosis Date Noted  . Atrial fibrillation with RVR (HCC) 10/10/2017  . HTN (hypertension) 10/10/2017  . Diabetes (HCC) 10/10/2017  . Syncope due to orthostatic hypotension 10/04/2017  . Dementia with behavioral disturbance 10/01/2017  . Syncope 09/30/2017    Orientation RESPIRATION BLADDER Height & Weight     Self  Normal Continent Weight: 168 lb (76.2 kg) Height:  5\' 7"  (170.2 cm)  BEHAVIORAL SYMPTOMS/MOOD NEUROLOGICAL BOWEL NUTRITION STATUS  Wanderer   Continent Diet(Regular Diet. )  AMBULATORY STATUS COMMUNICATION OF NEEDS Skin   Supervision Verbally Normal                       Personal Care Assistance Level of Assistance  Bathing, Feeding, Dressing Bathing Assistance: Limited assistance Feeding assistance: Independent Dressing Assistance: Limited assistance     Functional Limitations Info  Sight, Hearing, Speech Sight Info: Adequate Hearing Info: Adequate Speech Info: Adequate    SPECIAL CARE FACTORS FREQUENCY  PT (By licensed PT)     PT Frequency: (2-3 home health. )              Contractures      Additional Factors Info  Code Status, Allergies Code Status Info: (Full  Code. ) Allergies Info: (No Known Allergies. )           Current Medications (10/15/2017):  This is the current hospital active medication list No current facility-administered medications for this encounter.    Current Outpatient Medications  Medication Sig Dispense Refill  . clopidogrel (PLAVIX) 75 MG tablet Take 75 mg by mouth daily.    . metoprolol tartrate (LOPRESSOR) 25 MG tablet Take 1 tablet (25 mg total) by mouth 2 (two) times daily. 60 tablet 0  . pravastatin (PRAVACHOL) 80 MG tablet Take 80 mg by mouth daily.    Marland Kitchen. diltiazem (CARDIZEM) 30 MG tablet Take 1 tablet (30 mg total) by mouth 3 (three) times daily. 90 tablet 0  . OLANZapine (ZYPREXA) 5 MG tablet Take 1 tablet (5 mg total) by mouth at bedtime. 30 tablet 0  . QUEtiapine (SEROQUEL) 25 MG tablet Take 0.5 tablets (12.5 mg total) by mouth every 6 (six) hours as needed (anxiety). 30 tablet 0     Discharge Medications: Please see discharge summary for a list of discharge medications.  Relevant Imaging Results:  Relevant Lab Results:   Additional Information  SSN: [id number]  Sherrice Creekmore, [name], LCSW

## 2017-10-15 NOTE — Discharge Summary (Signed)
Meghan Lloyd, is a 76 y.o. female  DOB [birth year]  MRN [id number].  Admission date:  10/10/2017  Admitting Physician  Oralia Manis, MD  Discharge Date:  10/12/2017   Primary MD  Raynelle Bring  Recommendations for primary care physician for things to follow:    follow-up with PCP in 1 week   Admission Diagnosis  Atrial fibrillation, rapid (HCC) [I48.91] Syncope, unspecified syncope type [R55]   Discharge Diagnosis  Atrial fibrillation, rapid (HCC) [I48.91] Syncope, unspecified syncope type [R55]    Principal Problem:   Dementia with behavioral disturbance Active Problems:   Syncope   Atrial fibrillation with RVR (HCC)   HTN (hypertension)   Diabetes (HCC)      Past Medical History:  Diagnosis Date  . Dementia   . Diabetes mellitus without complication (HCC)   . Hypertension   . Orthostatic hypotension   . Persistent atrial fibrillation (HCC)    a. CHA2DS2VASc = 5-->No OAC in setting of dementia, ? compliance, falls.  . Syncope    a. likely 2/2 orthostasis;  b. 09/2017 Echo: Ef 50-55%, no rwma, mild AI/MR, mod dil LA/RA, mod TR, PASP ; c. 09/2017 Carotid U/S: <50% bilat ICA stenosis.    History reviewed. No pertinent surgical history.     History of present illness and  Hospital Course:     Kindly see H&P for history of present illness and admission details, please review complete Labs, Consult reports and Test reports for all details in brief  HPI  from the history and physical done on the day of admission 76 year old female patient with dementia admitted for syncope, A. fib with RVR.   admitted to telemetry. Marland Kitchen   Hospital Course    Syncope secondary to atrial fibrillation with RVR: Medication noncompliance with her dementia may not be taking metoprolol and Cardizem as she should.   Patient heart rate is stable because she is getting metoprolol in the hospital.  Seen by cardiology, patient initially admitted to telemetry but she started pulling all the wires can get agitated so moved her  To med  surg unitwith off unit telemetry.   Discharged home with metoprolol, small dose of Cardizem.  Discharged home with physical therapy, nursing.  Patient may be noncompliant because of dementia and she may keep coming with the same problem with atrial fibrillation with RVR again..    Patient is made incompetent by psych because of her dementia unable to make decisions, family wanted state to be the guardian.  Guardianship takes 4 weeks as per case manager, family wants guardianship from state.   2.persistent afib; rate controlled, seen by cardiology, continue beta-blockers.  Bblocker;  History of orthostatic hypotension: Blood pressure is better this admission so we stopped Midodrin, using metoprolol for heart rate controlled.    Confusion and dementia olanzapine dose is increased for agitation.  Seen by psych, patient clearly lacks the capacity to make any kind of reasonable decision.  Guardianship recommended, family wanted state to be the guardian for her.  Patient also received  sitter servicesduring hospitalization.  5. high risk for readmission, reviewed case management note. #3. history of dementia, medical history: Patient is on Seroquel, Risperdal. 4.  Dementia with noncompliance; patient is very demented, she lives with her stepson, step daughter-in-law.  Discharge with home health physical therapy, social worker, registered nurse, APS report is made and also for them to pursue guardianship.  Reviewed the social worker note.       Discharge Condition: Stable  Follow UP  Follow-up Information    Antonieta IbaGollan, Timothy J, MD Follow up on 10/31/2017.   Specialty:  Cardiology Why:  at 3:20pm Contact information: [address] [place]  [id number] [phone]        [facility]. Schedule an appointment as soon as possible for a visit in 1 week(s).   Why:  Walk-In Clinic only Contact information: [address] [place] [phone]             Discharge Instructions  and  Discharge Medications     Allergies as of 10/12/2017   No Known Allergies     Medication List    STOP taking these medications   diltiazem 180 MG 24 hr capsule Commonly known as:  CARDIZEM CD   midodrine 10 MG tablet Commonly known as:  PROAMATINE   potassium chloride SA 20 MEQ tablet Commonly known as:  K-DUR,KLOR-CON     TAKE these medications   clopidogrel 75 MG tablet Commonly known as:  PLAVIX Take 75 mg by mouth daily.   diltiazem 30 MG tablet Commonly known as:  CARDIZEM Take 1 tablet (30 mg total) by mouth 3 (three) times daily.   metoprolol tartrate 25 MG tablet Commonly known as:  LOPRESSOR Take 1 tablet (25 mg total) by mouth 2 (two) times daily.   OLANZapine 5 MG tablet Commonly known as:  ZYPREXA Take 1 tablet (5 mg total) by mouth at bedtime. What changed:    medication strength  how much to take   pravastatin 80 MG tablet Commonly known as:  PRAVACHOL Take 80 mg by mouth daily.   QUEtiapine 25 MG tablet Commonly known as:  SEROQUEL Take 0.5 tablets (12.5 mg total) by mouth every 6 (six) hours as needed (anxiety).         Diet and Activity recommendation: See Discharge Instructions above   Consults obtained - cardiology, psychiatry   Major procedures and Radiology Reports - PLEASE review detailed and final reports for all details, in brief -      Dg Thoracic Spine 2 View  Result Date: 09/30/2017 CLINICAL DATA:  Upper back pain. EXAM: THORACIC SPINE 2 VIEWS COMPARISON:  None. FINDINGS: There is no evidence of thoracic spine fracture. Alignment is normal. No other significant bone abnormalities are identified. IMPRESSION: Negative. Electronically Signed    By: Kennith CenterEric  Mansell M.D.   On: 09/30/2017 14:43   Dg Shoulder Right  Result Date: 10/11/2017 CLINICAL DATA:  Right shoulder pain after falling again today EXAM: RIGHT SHOULDER - 2+ VIEW COMPARISON:  10/10/2017 FINDINGS: Negative for acute fracture or dislocation. Mild AC degenerative changes. Visible portions of the right ribs are intact. IMPRESSION: Negative. Electronically Signed   By: Ellery Plunkaniel R Mitchell M.D.   On: 10/11/2017 22:33   Dg Shoulder Right  Result Date: 10/10/2017 CLINICAL DATA:  Right shoulder pain after multiple falls. EXAM: RIGHT SHOULDER - 2+ VIEW COMPARISON:  None. FINDINGS: There is no evidence of fracture or dislocation. 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 Soft tissues calcifications adjacent to the superior portion of the glenoid likely represent calcific tendinitis. IMPRESSION: No acute fracture or dislocation identified about the right shoulder. Electronically Signed   By: Ted Mcalpineobrinka  Dimitrova M.D.   On: 10/10/2017 18:53   Ct Head Wo Contrast  Result Date: 10/10/2017 CLINICAL DATA:  Status post 2 falls today. EXAM: CT HEAD WITHOUT CONTRAST CT CERVICAL SPINE WITHOUT CONTRAST TECHNIQUE: Multidetector CT imaging of the head and cervical spine was performed  following the standard protocol without intravenous contrast. Multiplanar CT image reconstructions of the cervical spine were also generated. COMPARISON:  10/10/2017 FINDINGS: CT HEAD FINDINGS Brain: No evidence of acute infarction, hemorrhage, hydrocephalus, extra-axial collection or mass lesion/mass effect. Mild brain parenchymal volume loss and periventricular microangiopathy. Vascular: Calcific atherosclerotic disease of intra cavernous internal carotid arteries. Skull: Normal. Negative for fracture or focal lesion. Sinuses/Orbits: Dense opacification of the left maxillary sinus with high density material with periosteal thickening in the sinus walls, evidence of chronic sinusitis. Other: Left parietal  scalp hematoma. CT CERVICAL SPINE FINDINGS Alignment: Normal. Skull base and vertebrae: No acute fracture. No primary bone lesion or focal pathologic process. Soft tissues and spinal canal: No prevertebral fluid or swelling. No visible canal hematoma. Disc levels: Multilevel osteoarthritic changes, with prominent posterior facet arthropathy. Upper chest: Right apical subpleural scarring. 7 mm nodular area in the right apex may represent scarring versus a pulmonary nodule, image 83/95, sequence 6. Other: None. IMPRESSION: No acute intracranial abnormality. No evidence of acute traumatic injury to the cervical spine. Chronic left maxillary sinusitis. 7 mm pulmonary nodule versus pleuroparenchymal scarring in the right lung apex. Non-contrast chest CT at 6-12 months is recommended. If the nodule is stable at time of repeat CT, then future CT at 18-24 months (from today's scan) is considered optional for low-risk patients, but is recommended for high-risk patients. This recommendation follows the consensus statement: Guidelines for Management of Incidental Pulmonary Nodules Detected on CT Images: From the Fleischner Society 2017; Radiology 2017; 284:228-243. Electronically Signed   By: Ted Mcalpine M.D.   On: 10/10/2017 19:04   Ct Head Wo Contrast  Result Date: 10/10/2017 CLINICAL DATA:  Dizziness.  Possible head injury. EXAM: CT HEAD WITHOUT CONTRAST TECHNIQUE: Contiguous axial images were obtained from the base of the skull through the vertex without intravenous contrast. COMPARISON:  CT scan of September 30, 2017. FINDINGS: Brain: Mild diffuse cortical atrophy is noted. Mild chronic ischemic white matter disease is noted. No mass effect or midline shift is noted. Ventricular size is within normal limits. There is no evidence of mass lesion, hemorrhage or acute infarction. Vascular: No hyperdense vessel or unexpected calcification. Skull: Normal. Negative for fracture or focal lesion. Sinuses/Orbits: No  acute finding. Other: Small left parietal scalp hematoma is noted. IMPRESSION: Small left parietal scalp hematoma. Mild diffuse cortical atrophy. Mild chronic ischemic white matter disease. No acute intracranial abnormality seen. Electronically Signed   By: Lupita Raider, M.D.   On: 10/10/2017 12:32   Ct Head Wo Contrast  Result Date: 09/30/2017 CLINICAL DATA:  Syncope with dizziness. EXAM: CT HEAD WITHOUT CONTRAST TECHNIQUE: Contiguous axial images were obtained from the base of the skull through the vertex without intravenous contrast. COMPARISON:  None. FINDINGS: Brain: There is no evidence for acute hemorrhage, hydrocephalus, mass lesion, or abnormal extra-axial fluid collection. No definite CT evidence for acute infarction. Diffuse loss of parenchymal volume is consistent with atrophy. Vascular: No hyperdense vessel or unexpected calcification. Skull: No evidence for fracture. No worrisome lytic or sclerotic lesion. Sinuses/Orbits: The visualized paranasal sinuses and mastoid air cells are clear. Visualized portions of the globes and intraorbital fat are unremarkable. Other: None. IMPRESSION: 1. No acute intracranial abnormality. Electronically Signed   By: Kennith Center M.D.   On: 09/30/2017 11:11   Ct Cervical Spine Wo Contrast  Result Date: 10/10/2017 CLINICAL DATA:  Status post 2 falls today. EXAM: CT HEAD WITHOUT CONTRAST CT CERVICAL SPINE WITHOUT CONTRAST TECHNIQUE: Multidetector CT imaging of the  head and cervical spine was performed following the standard protocol without intravenous contrast. Multiplanar CT image reconstructions of the cervical spine were also generated. COMPARISON:  10/10/2017 FINDINGS: CT HEAD FINDINGS Brain: No evidence of acute infarction, hemorrhage, hydrocephalus, extra-axial collection or mass lesion/mass effect. Mild brain parenchymal volume loss and periventricular microangiopathy. Vascular: Calcific atherosclerotic disease of intra cavernous internal carotid  arteries. Skull: Normal. Negative for fracture or focal lesion. Sinuses/Orbits: Dense opacification of the left maxillary sinus with high density material with periosteal thickening in the sinus walls, evidence of chronic sinusitis. Other: Left parietal scalp hematoma. CT CERVICAL SPINE FINDINGS Alignment: Normal. Skull base and vertebrae: No acute fracture. No primary bone lesion or focal pathologic process. Soft tissues and spinal canal: No prevertebral fluid or swelling. No visible canal hematoma. Disc levels: Multilevel osteoarthritic changes, with prominent posterior facet arthropathy. Upper chest: Right apical subpleural scarring. 7 mm nodular area in the right apex may represent scarring versus a pulmonary nodule, image 83/95, sequence 6. Other: None. IMPRESSION: No acute intracranial abnormality. No evidence of acute traumatic injury to the cervical spine. Chronic left maxillary sinusitis. 7 mm pulmonary nodule versus pleuroparenchymal scarring in the right lung apex. Non-contrast chest CT at 6-12 months is recommended. If the nodule is stable at time of repeat CT, then future CT at 18-24 months (from today's scan) is considered optional for low-risk patients, but is recommended for high-risk patients. This recommendation follows the consensus statement: Guidelines for Management of Incidental Pulmonary Nodules Detected on CT Images: From the Fleischner Society 2017; Radiology 2017; 284:228-243. Electronically Signed   By: Ted Mcalpine M.D.   On: 10/10/2017 19:04   US Carotid Bilateral  Result Date: 10/01/2017 CLINICAL DATA:  Syncope EXAM: BILATERAL CAROTID DUPLEX ULTRASOUND TECHNIQUE: Wallace Cullens scale imaging, color Doppler and duplex ultrasound were performed of bilateral carotid and vertebral arteries in the neck. COMPARISON:  None. FINDINGS: Criteria: Quantification of carotid stenosis is based on velocity parameters that correlate the residual internal carotid diameter with NASCET-based stenosis  levels, using the diameter of the distal internal carotid lumen as the denominator for stenosis measurement. The following velocity measurements were obtained: RIGHT ICA:  64 cm/sec CCA:  69 cm/sec SYSTOLIC ICA/CCA RATIO:  0.9 DIASTOLIC ICA/CCA RATIO:  1.9 ECA:  78 cm/sec LEFT ICA:  87 cm/sec CCA:  49 cm/sec SYSTOLIC ICA/CCA RATIO:  1.8 DIASTOLIC ICA/CCA RATIO:  3.0 ECA:  99 cm/sec RIGHT CAROTID ARTERY: Mild irregular soft plaque in the bulb. There is mild calcified plaque along the wall of the lower internal carotid artery. Low resistance internal carotid Doppler pattern is preserved. RIGHT VERTEBRAL ARTERY:  Antegrade. LEFT CAROTID ARTERY: Little if any plaque in the bulb. There is mild calcified plaque in the lower internal carotid artery. There is moderate irregular calcified plaque in the mid internal carotid artery. Low resistance internal carotid Doppler pattern is preserved. LEFT VERTEBRAL ARTERY:  Antegrade. IMPRESSION: Less than 50% stenosis in the right and left internal carotid arteries. Electronically Signed   By: Jolaine Click M.D.   On: 10/01/2017 11:09   Dg Chest Portable 1 View  Result Date: 09/30/2017 CLINICAL DATA:  Syncope EXAM: PORTABLE CHEST 1 VIEW COMPARISON:  None. FINDINGS: Heart is borderline in size. Lungs are clear. No effusions or acute bony abnormality. IMPRESSION: Mild cardiomegaly.  No active disease. Electronically Signed   By: Charlett Nose M.D.   On: 09/30/2017 11:28    Micro Results     No results found for this or any previous visit (from  the past 240 hour(s)).     Today   Subjective:   Meghan Lloyd today has no headache,no chest abdominal pain,no new weakness tingling or numbness, feels much better wants to go home today. *  Objective:   Blood pressure (!) 109/48, pulse 77, temperature (!) 97.5 F (36.4 C), temperature source Oral, resp. rate 18, height 5\' 7"  (1.702 m), weight 76.2 kg (168 lb), SpO2 99 %.  No intake or output data in the 24 hours ending  10/15/17 1524  Exam Disoriented with baseline dementia no new F.N deficits, Normal affect [place].AT,PERRAL Supple Neck,No JVD, No cervical lymphadenopathy appriciated.  Symmetrical Chest wall movement, Good air movement bilaterally, CTAB RRR,No Gallops,Rubs or new Murmurs, No Parasternal Heave +ve B.Sounds, Abd Soft, Non tender, No organomegaly appriciated, No rebound -guarding or rigidity. No Cyanosis, Clubbing or edema, No new Rash or bruise  Data Review   CBC w Diff:  Lab Results  Component Value Date   WBC 6.2 10/11/2017   HGB 12.9 10/11/2017   HCT 37.6 10/11/2017   PLT 93 (L) 10/11/2017   LYMPHOPCT 19 10/10/2017   MONOPCT 6 10/10/2017   EOSPCT 0 10/10/2017   BASOPCT 0 10/10/2017    CMP:  Lab Results  Component Value Date   NA 137 10/11/2017   K 4.1 10/11/2017   CL 103 10/11/2017   CO2 26 10/11/2017   BUN 28 (H) 10/11/2017   CREATININE 1.04 (H) 10/11/2017   PROT 6.7 10/10/2017   ALBUMIN 3.9 10/10/2017   BILITOT 1.0 10/10/2017   ALKPHOS 60 10/10/2017   AST 21 10/10/2017   ALT 21 10/10/2017  .   Total Time in preparing paper work, data evaluation and todays exam - 35 minutes  Katha HammingSnehalatha Smita Lesh M.D on 10/12/2017 at 3:24 PM    Note: This dictation was prepared with Dragon dictation along with smaller phrase technology. Any transcriptional errors that result from this process are unintentional.

## 2017-10-31 ENCOUNTER — Ambulatory Visit: Payer: Medicare Other | Admitting: Cardiovascular Disease

## 2017-10-31 ENCOUNTER — Encounter: Payer: Self-pay | Admitting: Cardiovascular Disease

## 2017-10-31 VITALS — BP 120/64 | HR 84 | Ht 67.0 in | Wt 170.2 lb

## 2017-10-31 DIAGNOSIS — I10 Essential (primary) hypertension: Secondary | ICD-10-CM | POA: Diagnosis not present

## 2017-10-31 DIAGNOSIS — F0281 Dementia in other diseases classified elsewhere with behavioral disturbance: Secondary | ICD-10-CM

## 2017-10-31 DIAGNOSIS — I951 Orthostatic hypotension: Secondary | ICD-10-CM | POA: Diagnosis not present

## 2017-10-31 DIAGNOSIS — I4891 Unspecified atrial fibrillation: Secondary | ICD-10-CM

## 2017-10-31 DIAGNOSIS — E119 Type 2 diabetes mellitus without complications: Secondary | ICD-10-CM

## 2017-10-31 DIAGNOSIS — G301 Alzheimer's disease with late onset: Secondary | ICD-10-CM | POA: Diagnosis not present

## 2017-10-31 MED ORDER — METOPROLOL TARTRATE 25 MG PO TABS: 25.0000 mg | ORAL_TABLET | Freq: Two times a day (BID) | ORAL | 3 refills | Status: DC

## 2017-10-31 MED ORDER — PRAVASTATIN SODIUM 80 MG PO TABS: 80.0000 mg | ORAL_TABLET | Freq: Every day | ORAL | 3 refills | Status: AC

## 2017-10-31 MED ORDER — DILTIAZEM HCL 30 MG PO TABS: 30.0000 mg | ORAL_TABLET | Freq: Three times a day (TID) | ORAL | 3 refills | Status: DC

## 2017-10-31 NOTE — Progress Notes (Signed)
Cardiology Office Note  Date:  10/31/2017   ID:  Cannon KettleDorothy Euceda, DOB Feb 21, 1941, MRN [id number]  [facility]  [facility]   Chief Complaint  Patient presents with  . OTHER    Afib c/o chest pain and fatigue. Meds reviewed verbally with pt.    HPI:  77 y.o.femalewith a history of  Afib,  Dementia,  syncope/orthostatic hypotension,  readmitted 12/12 with syncope and rapid afib. Who presents today for follow-up of her orthostatic hypotension, atrial fibrillation Hospitalization December 2018 for orthostatic hypotension  Reports that she was seen by primary care physician today, Started on Zantac,  Seen by neurology today, on aricept, depakote, seoquel  Currently living with a friend who is helping to take care of her and her medications Fall two weeks ago, Lost her balance  some dizziness in AM, better after eating Rare dizziness She is not taking midodrine  admitted w/ recurrent syncope on 12/12 in setting of known orthostatic hypotension  Also in rapid afib on arrival.    CHA2DS2VASc = 5-6,  [facility] in setting of dementia and falls.  EKG personally reviewed by myself on todays visit Atrial fibrillation with ventricular rate 84 bpm right bundle branch block T wave abnormality precordial leads, 1, 3, aVF   PMH:   has a past medical history of Dementia, Diabetes mellitus without complication (HCC), Hypertension, Orthostatic hypotension, Persistent atrial fibrillation (HCC), and Syncope.  PSH:   History reviewed. No pertinent surgical history.  Current Outpatient Medications  Medication Sig Dispense Refill  . clopidogrel (PLAVIX) 75 MG tablet Take 75 mg by mouth daily.    Marland Kitchen. diltiazem (CARDIZEM) 30 MG tablet Take 1 tablet (30 mg total) by mouth 3 (three) times daily. 270 tablet 3  . metoprolol tartrate (LOPRESSOR) 25 MG tablet Take 1 tablet (25 mg total) by mouth 2 (two) times daily. 180 tablet 3  . OLANZapine (ZYPREXA) 5 MG tablet Take 1 tablet (5 mg  total) by mouth at bedtime. 30 tablet 0  . pravastatin (PRAVACHOL) 80 MG tablet Take 1 tablet (80 mg total) by mouth daily. 90 tablet 3  . QUEtiapine (SEROQUEL) 25 MG tablet Take 0.5 tablets (12.5 mg total) by mouth every 6 (six) hours as needed (anxiety). 30 tablet 0   No current facility-administered medications for this visit.      Allergies:   Patient has no known allergies.   Social History:  The patient  reports that she has been smoking.  she has never used smokeless tobacco. She reports that she drinks alcohol. She reports that she does not use drugs.   Family History:   Family history is unknown by patient.    Review of Systems: Review of Systems  Constitutional: Negative.   Respiratory: Negative.   Cardiovascular: Negative.   Gastrointestinal: Negative.   Musculoskeletal: Negative.   Neurological: Negative.   Psychiatric/Behavioral: Negative.   All other systems reviewed and are negative.    PHYSICAL EXAM: VS:  BP 120/64 (BP Location: Left Arm, Patient Position: Sitting, Cuff Size: Normal)   Pulse 84   Ht 5\' 7"  (1.702 m)   Wt 170 lb 4 oz (77.2 kg)   BMI 26.66 kg/m  , BMI Body mass index is 26.66 kg/m. GEN: Well nourished, well developed, in no acute distress  HEENT: normal  Neck: no JVD, carotid bruits, or masses Cardiac: Irregularly irregular, no murmurs, rubs, or gallops,no edema  Respiratory:  clear to auscultation bilaterally, normal work of breathing GI: soft, nontender, nondistended, + BS MS:  no deformity or atrophy. Skin: warm and dry, no rash Neuro:  Strength and sensation are intact Psych: euthymic mood, mild confusion    Recent Labs: 10/01/2017: TSH 0.867 10/10/2017: ALT 21 10/11/2017: BUN 28; Creatinine, Ser 1.04; Hemoglobin 12.9; Platelets 93; Potassium 4.1; Sodium 137    Lipid Panel No results found for: CHOL, HDL, LDLCALC, TRIG    Wt Readings from Last 3 Encounters:  10/31/17 170 lb 4 oz (77.2 kg)  10/10/17 168 lb (76.2 kg)   10/10/17 165 lb (74.8 kg)       ASSESSMENT AND PLAN:  Atrial fibrillation with RVR (HCC) - Plan: EKG 12-Lead Rate relatively well controlled on metoprolol  Long discussion with friend/caretaker, Recent falls Does not feel she would be a good candidate for anticoagulation given her gait instability inability to manage her own medications  Syncope due to orthostatic hypotension Currently not on midodrine.  This was discontinued at the time of discharge She does have dizziness in the morning but this improves after eating and if she moves slowly We will not restart midodrine at this time  Essential hypertension Blood pressure is well controlled on today's visit. No changes made to the medications.  Type 2 diabetes mellitus without complication, without long-term current use of insulin (HCC) We have encouraged continued exercise, careful diet management in an effort to lose weight.  Late onset Alzheimer's disease with behavioral disturbance Managed by neurology   Total encounter time more than 25 minutes  Greater than 50% was spent in counseling and coordination of care with the patient   Disposition:   F/U  6 months   Orders Placed This Encounter  Procedures  . EKG 12-Lead     Signed, Dossie Arbour, M.D., Ph.D. 10/31/2017  [facility], [facility] [place], [place] [phone]

## 2017-10-31 NOTE — Patient Instructions (Signed)

## 2017-11-01 ENCOUNTER — Other Ambulatory Visit: Payer: Self-pay | Admitting: Family Medicine

## 2017-11-01 DIAGNOSIS — R911 Solitary pulmonary nodule: Secondary | ICD-10-CM

## 2017-12-20 ENCOUNTER — Telehealth: Payer: Self-pay | Admitting: Cardiovascular Disease

## 2017-12-20 NOTE — Telephone Encounter (Signed)
Nurse with [facility] states pt is in afib. Please call to discuss.

## 2017-12-20 NOTE — Telephone Encounter (Signed)
[facility] has appointment to see Dr. Mariah MillingGollan tomorrow per referral for lower extremity swelling. She confirmed appointment information with no further questions at this time.

## 2017-12-21 ENCOUNTER — Encounter: Payer: Self-pay | Admitting: Cardiovascular Disease

## 2017-12-21 ENCOUNTER — Ambulatory Visit (INDEPENDENT_AMBULATORY_CARE_PROVIDER_SITE_OTHER): Payer: Medicare Other | Admitting: Cardiovascular Disease

## 2017-12-21 VITALS — BP 110/62 | HR 80 | Ht 67.5 in | Wt 180.5 lb

## 2017-12-21 DIAGNOSIS — R600 Localized edema: Secondary | ICD-10-CM

## 2017-12-21 DIAGNOSIS — I951 Orthostatic hypotension: Secondary | ICD-10-CM

## 2017-12-21 DIAGNOSIS — I4891 Unspecified atrial fibrillation: Secondary | ICD-10-CM | POA: Diagnosis not present

## 2017-12-21 DIAGNOSIS — F02818 Dementia in other diseases classified elsewhere, unspecified severity, with other behavioral disturbance: Secondary | ICD-10-CM

## 2017-12-21 DIAGNOSIS — I5032 Chronic diastolic (congestive) heart failure: Secondary | ICD-10-CM | POA: Diagnosis not present

## 2017-12-21 DIAGNOSIS — G301 Alzheimer's disease with late onset: Secondary | ICD-10-CM

## 2017-12-21 DIAGNOSIS — F0281 Dementia in other diseases classified elsewhere with behavioral disturbance: Secondary | ICD-10-CM | POA: Diagnosis not present

## 2017-12-21 DIAGNOSIS — E119 Type 2 diabetes mellitus without complications: Secondary | ICD-10-CM | POA: Diagnosis not present

## 2017-12-21 MED ORDER — FUROSEMIDE 40 MG PO TABS: 40.0000 mg | ORAL_TABLET | Freq: Two times a day (BID) | ORAL | 3 refills | Status: AC

## 2017-12-21 MED ORDER — POTASSIUM CHLORIDE CRYS ER 20 MEQ PO TBCR: 20.0000 meq | EXTENDED_RELEASE_TABLET | Freq: Every day | ORAL | 3 refills | Status: DC

## 2017-12-21 MED ORDER — METOPROLOL TARTRATE 75 MG PO TABS: 75.0000 mg | ORAL_TABLET | Freq: Two times a day (BID) | ORAL | 3 refills | Status: DC

## 2017-12-21 NOTE — Progress Notes (Signed)
Cardiology Office Note  Date:  12/21/2017   ID:  Meghan Lloyd, DOB 1940/11/30, MRN [id number]  [facility]   Chief Complaint  Patient presents with  . Other    Per [facility] lower extremity swelling and afib. Meds reviewed verbally with patient.     HPI:  77 y.o.femalewith a history of  Afib,  Dementia, syncope/orthostatic hypotension,  readmitted 12/12 with syncope and rapid afib. Hospitalization December 2018 for orthostatic hypotension Who presents today for follow-up of her orthostatic hypotension, atrial fibrillation  She presents today with caretaker, in a wheelchair Worsening lower extremity edema Weight is up approximately 10 pounds from last clinic visit previously 170 pounds now 180 pounds Several months ago weight was 166 Denies high fluid intake Has been eating several meals per day but not overeating per the caretaker  Recently on Lasix for several days Unclear if this helped the leg swelling She denies significant shortness of breath but she is sedentary, no regular activity  Denies abdominal bloating, does not think edema extends up into the thighs Tolerating metoprolol and diltiazem  EKG personally reviewed by myself on todays visit Shows atrial fibrillation with ventricular rate 80 bpm right bundle branch block other past medical history reviewed  Previously seen by neurology  on aricept, depakote, seoquel  admitted w/ recurrent syncope on 12/12 in setting of known orthostatic hypotension  Also in rapid afib on arrival.    CHA2DS2VASc = 5-6,  [facility].    PMH:   has a past medical history of Dementia, Diabetes mellitus without complication (HCC), Hypertension, Orthostatic hypotension, Persistent atrial fibrillation (HCC), and Syncope.  PSH:   No past surgical history on file.  Current Outpatient Medications  Medication Sig Dispense Refill  . clopidogrel (PLAVIX) 75 MG tablet  Take 75 mg by mouth daily.    Marland Kitchen. diltiazem (CARDIZEM) 30 MG tablet Take 1 tablet (30 mg total) by mouth 3 (three) times daily. 270 tablet 3  . divalproex (DEPAKOTE) 125 MG DR tablet Take 125 mg by mouth 2 (two) times daily.    Marland Kitchen. donepezil (ARICEPT) 5 MG tablet Take 5 mg by mouth at bedtime.    . metFORMIN (GLUCOPHAGE) 500 MG tablet Take by mouth.    . metoprolol tartrate (LOPRESSOR) 50 MG tablet Take 50 mg by mouth 2 (two) times daily.    Marland Kitchen. OLANZapine (ZYPREXA) 5 MG tablet Take 1 tablet (5 mg total) by mouth at bedtime. 30 tablet 0  . pravastatin (PRAVACHOL) 80 MG tablet Take 1 tablet (80 mg total) by mouth daily. 90 tablet 3  . QUEtiapine (SEROQUEL) 25 MG tablet Take 0.5 tablets (12.5 mg total) by mouth every 6 (six) hours as needed (anxiety). 30 tablet 0   No current facility-administered medications for this visit.      Allergies:   Patient has no known allergies.   Social History:  The patient  reports that she has been smoking.  She has been smoking about 0.25 packs per day. she has never used smokeless tobacco. She reports that she drinks alcohol. She reports that she does not use drugs.   Family History:   Family history is unknown by patient.    Review of Systems: Review of Systems  Constitutional: Negative.        Weight gain  Respiratory: Negative.   Cardiovascular: Positive for leg swelling.  Gastrointestinal: Negative.   Musculoskeletal: Negative.   Neurological: Negative.   Psychiatric/Behavioral: Negative.  All other systems reviewed and are negative.    PHYSICAL EXAM: VS:  BP 110/62 (BP Location: Left Arm, Patient Position: Sitting, Cuff Size: Large)   Pulse 80   Ht 5' 7.5" (1.715 m)   Wt 180 lb 8 oz (81.9 kg)   BMI 27.85 kg/m  , BMI Body mass index is 27.85 kg/m. Constitutional:  oriented to person, place, and time. No distress.  HENT:  Head: Normocephalic and atraumatic.  Eyes:  no discharge. No scleral icterus.  Neck: Normal range of motion. Neck  supple. No JVD present.  Cardiovascular: Normal rate, regular rhythm, normal heart sounds and intact distal pulses. Exam reveals no gallop and no friction rub.  1+ pitting lower extremity edema to below the knees No murmur heard. Pulmonary/Chest: Effort normal and breath sounds normal. No stridor. No respiratory distress.  no wheezes.  no rales.  no tenderness.  Abdominal: Soft.  no distension.  no tenderness.  Musculoskeletal: Normal range of motion.  no  tenderness or deformity.  Neurological:  normal muscle tone. Coordination normal. No atrophy Skin: Skin is warm and dry. No rash noted. not diaphoretic.  Psychiatric:  normal mood and affect. behavior is normal. Thought content normal.      Recent Labs: 10/01/2017: TSH 0.867 10/10/2017: ALT 21 10/11/2017: BUN 28; Creatinine, Ser 1.04; Hemoglobin 12.9; Platelets 93; Potassium 4.1; Sodium 137    Lipid Panel No results found for: CHOL, HDL, LDLCALC, TRIG    Wt Readings from Last 3 Encounters:  12/21/17 180 lb 8 oz (81.9 kg)  10/31/17 170 lb 4 oz (77.2 kg)  10/10/17 168 lb (76.2 kg)       ASSESSMENT AND PLAN:  Atrial fibrillation with RVR (HCC) - Plan: EKG 12-Lead Given her leg swelling we will hold the diltiazem Increase metoprolol up to 75 mg twice daily High fall risk, not on anticoagulation at this time Prior history of syncope from orthostasis leading to falls  Syncope due to orthostatic hypotension Previously on midodrine, not requiring this recently We will hold the diltiazem as above, increase metoprolol for rate control  Essential hypertension Blood pressure low but stable  Lower extremity edema Concerning for chronic diastolic CHF Dramatic weight gain over the past several months Now with worsening lower extremity edema concerning for fluid retention Recommend she start Lasix 40 daily with potassium 20 BMP in 2 weeks time Hold the diltiazem which can be associated with leg swelling Unable to exclude  dependent edema, from sitting more , recommended compression hose   Type 2 diabetes mellitus without complication, without long-term current use of insulin (HCC) Weight trending upwards, will need to closely monitor diabetes numbers  Late onset Alzheimer's disease with behavioral disturbance Managed by neurology   Total encounter time more than 45 minutes  Greater than 50% was spent in counseling and coordination of care with the patient   Disposition:   F/U  3 months   No orders of the defined types were placed in this encounter.    Signed, Dossie Arbour, M.D., Ph.D. 12/21/2017  [facility] [place], [place] [phone]

## 2017-12-21 NOTE — Patient Instructions (Addendum)
Compression hose during the day, 10 to 20 mm Hg  Medication Instructions:   Please stop the diltiazem Increase the metoprolol up to 75 mg twice a day Start lasix 40 mg daily with potassium 20 meq daily   Labwork:  BMP in 2 weeks Fax number [phone]  Testing/Procedures:  No further testing at this time   Follow-Up: It was a pleasure seeing you in the office today. Please call us if you have new issues that need to be addressed before your next appt.  [phone]  Your physician wants you to follow-up in: 3 months.  You will receive a reminder letter in the mail two months in advance. If you don't receive a letter, please call our office to schedule the follow-up appointment.  If you need a refill on your cardiac medications before your next appointment, please call your pharmacy.  For educational health videos Log in to : [email or web address] Or : [email or web address], password : [email or web address]

## 2018-02-13 ENCOUNTER — Other Ambulatory Visit
Admission: RE | Admit: 2018-02-13 | Discharge: 2018-02-13 | Disposition: A | Discharge status: Home / Self Care | Payer: Medicaid Other | Source: Other Acute Inpatient Hospital | Attending: Family Medicine | Admitting: Family Medicine

## 2018-02-13 DIAGNOSIS — I959 Hypotension, unspecified: Secondary | ICD-10-CM | POA: Diagnosis present

## 2018-02-13 DIAGNOSIS — E119 Type 2 diabetes mellitus without complications: Secondary | ICD-10-CM | POA: Insufficient documentation

## 2018-02-13 LAB — CBC WITH DIFFERENTIAL/PLATELET
Basophils Absolute: 0 10*3/uL (ref 0–0.1)
Basophils Relative: 0 %
EOS ABS: 0.1 10*3/uL (ref 0–0.7)
EOS PCT: 1 %
HCT: 38.1 % (ref 35.0–47.0)
Hemoglobin: 13 g/dL (ref 12.0–16.0)
LYMPHS ABS: 0.8 10*3/uL — AB (ref 1.0–3.6)
LYMPHS PCT: 15 %
MCH: 31.6 pg (ref 26.0–34.0)
MCHC: 34 g/dL (ref 32.0–36.0)
MCV: 93 fL (ref 80.0–100.0)
MONO ABS: 0.4 10*3/uL (ref 0.2–0.9)
Monocytes Relative: 7 %
Neutro Abs: 4.1 10*3/uL (ref 1.4–6.5)
Neutrophils Relative %: 77 %
PLATELETS: 120 10*3/uL — AB (ref 150–440)
RBC: 4.1 MIL/uL (ref 3.80–5.20)
RDW: 14.3 % (ref 11.5–14.5)
WBC: 5.4 10*3/uL (ref 3.6–11.0)

## 2018-02-13 LAB — COMPREHENSIVE METABOLIC PANEL
ALT: 13 U/L — ABNORMAL LOW (ref 14–54)
ANION GAP: 8 (ref 5–15)
AST: 19 U/L (ref 15–41)
Albumin: 3.4 g/dL — ABNORMAL LOW (ref 3.5–5.0)
Alkaline Phosphatase: 55 U/L (ref 38–126)
BUN: 28 mg/dL — ABNORMAL HIGH (ref 6–20)
CHLORIDE: 101 mmol/L (ref 101–111)
CO2: 31 mmol/L (ref 22–32)
Calcium: 8.8 mg/dL — ABNORMAL LOW (ref 8.9–10.3)
Creatinine, Ser: 0.91 mg/dL (ref 0.44–1.00)
GFR, EST NON AFRICAN AMERICAN: 59 mL/min — AB (ref >60)
Glucose, Bld: 209 mg/dL — ABNORMAL HIGH (ref 65–99)
POTASSIUM: 4.7 mmol/L (ref 3.5–5.1)
Sodium: 140 mmol/L (ref 135–145)
Total Bilirubin: 0.7 mg/dL (ref 0.3–1.2)
Total Protein: 6.4 g/dL — ABNORMAL LOW (ref 6.5–8.1)

## 2018-03-16 NOTE — Progress Notes (Signed)
Cardiology Office Note  Date:  03/16/2018   ID:  Meghan Lloyd, DOB 29-Jul-1941, MRN [id number]  PCP:  [facility]   No chief complaint on file.   HPI:  77 y.o.femalewith a history of  Afib,  Dementia, syncope/orthostatic hypotension,  readmitted 12/12 with syncope and rapid afib. Hospitalization December 2018 for orthostatic hypotension Who presents today for follow-up of her orthostatic hypotension, atrial fibrillation  She presents today with caretaker, in a wheelchair Worsening lower extremity edema Weight is up approximately 10 pounds from last clinic visit previously 170 pounds now 180 pounds Several months ago weight was 166 Denies high fluid intake Has been eating several meals per day but not overeating per the caretaker  Recently on Lasix for several days Unclear if this helped the leg swelling She denies significant shortness of breath but she is sedentary, no regular activity  Denies abdominal bloating, does not think edema extends up into the thighs Tolerating metoprolol and diltiazem  EKG personally reviewed by myself on todays visit Shows atrial fibrillation with ventricular rate 80 bpm right bundle branch block other past medical history reviewed  Previously seen by neurology  on aricept, depakote, seoquel  admitted w/ recurrent syncope on 12/12 in setting of known orthostatic hypotension  Also in rapid afib on arrival.    CHA2DS2VASc = 5-6,  poor candidate for [facility] in setting of dementia and falls.    PMH:   has a past medical history of Dementia, Diabetes mellitus without complication (HCC), Hypertension, Orthostatic hypotension, Persistent atrial fibrillation (HCC), and Syncope.  PSH:   No past surgical history on file.  Current Outpatient Medications  Medication Sig Dispense Refill  . clopidogrel (PLAVIX) 75 MG tablet Take 75 mg by mouth daily.    . divalproex (DEPAKOTE) 125 MG DR tablet Take 125 mg by mouth 2 (two) times daily.    Marland Kitchen  donepezil (ARICEPT) 5 MG tablet Take 5 mg by mouth at bedtime.    . furosemide (LASIX) 40 MG tablet Take 1 tablet (40 mg total) by mouth 2 (two) times daily. 90 tablet 3  . metFORMIN (GLUCOPHAGE) 500 MG tablet Take by mouth.    . metoprolol tartrate 75 MG TABS Take 75 mg by mouth 2 (two) times daily. 180 tablet 3  . OLANZapine (ZYPREXA) 5 MG tablet Take 1 tablet (5 mg total) by mouth at bedtime. 30 tablet 0  . potassium chloride SA (K-DUR,KLOR-CON) 20 MEQ tablet Take 1 tablet (20 mEq total) by mouth daily. 90 tablet 3  . pravastatin (PRAVACHOL) 80 MG tablet Take 1 tablet (80 mg total) by mouth daily. 90 tablet 3  . QUEtiapine (SEROQUEL) 25 MG tablet Take 0.5 tablets (12.5 mg total) by mouth every 6 (six) hours as needed (anxiety). 30 tablet 0   No current facility-administered medications for this visit.      Allergies:   Patient has no known allergies.   Social History:  The patient  reports that she has been smoking.  She has been smoking about 0.25 packs per day. She has never used smokeless tobacco. She reports that she drinks alcohol. She reports that she does not use drugs.   Family History:   Family history is unknown by patient.    Review of Systems: Review of Systems  Constitutional: Negative.        Weight gain  Respiratory: Negative.   Cardiovascular: Positive for leg swelling.  Gastrointestinal: Negative.   Musculoskeletal: Negative.   Neurological: Negative.   Psychiatric/Behavioral: Negative.   All  other systems reviewed and are negative.    PHYSICAL EXAM: VS:  There were no vitals taken for this visit. , BMI There is no height or weight on file to calculate BMI. Constitutional:  oriented to person, place, and time. No distress.  HENT:  Head: Normocephalic and atraumatic.  Eyes:  no discharge. No scleral icterus.  Neck: Normal range of motion. Neck supple. No JVD present.  Cardiovascular: Normal rate, regular rhythm, normal heart sounds and intact distal pulses.  Exam reveals no gallop and no friction rub.  1+ pitting lower extremity edema to below the knees No murmur heard. Pulmonary/Chest: Effort normal and breath sounds normal. No stridor. No respiratory distress.  no wheezes.  no rales.  no tenderness.  Abdominal: Soft.  no distension.  no tenderness.  Musculoskeletal: Normal range of motion.  no  tenderness or deformity.  Neurological:  normal muscle tone. Coordination normal. No atrophy Skin: Skin is warm and dry. No rash noted. not diaphoretic.  Psychiatric:  normal mood and affect. behavior is normal. Thought content normal.      Recent Labs: 10/01/2017: TSH 0.867 02/13/2018: ALT 13; BUN 28; Creatinine, Ser 0.91; Hemoglobin 13.0; Platelets 120; Potassium 4.7; Sodium 140    Lipid Panel No results found for: CHOL, HDL, LDLCALC, TRIG    Wt Readings from Last 3 Encounters:  12/21/17 180 lb 8 oz (81.9 kg)  10/31/17 170 lb 4 oz (77.2 kg)  10/10/17 168 lb (76.2 kg)       ASSESSMENT AND PLAN:  Atrial fibrillation with RVR (HCC) - Plan: EKG 12-Lead Given her leg swelling we will hold the diltiazem Increase metoprolol up to 75 mg twice daily High fall risk, not on anticoagulation at this time Prior history of syncope from orthostasis leading to falls  Syncope due to orthostatic hypotension Previously on midodrine, not requiring this recently We will hold the diltiazem as above, increase metoprolol for rate control  Essential hypertension Blood pressure low but stable  Lower extremity edema Concerning for chronic diastolic CHF Dramatic weight gain over the past several months Now with worsening lower extremity edema concerning for fluid retention Recommend she start Lasix 40 daily with potassium 20 BMP in 2 weeks time Hold the diltiazem which can be associated with leg swelling Unable to exclude dependent edema, from sitting more , recommended compression hose   Type 2 diabetes mellitus without complication, without long-term  current use of insulin (HCC) Weight trending upwards, will need to closely monitor diabetes numbers  Late onset Alzheimer's disease with behavioral disturbance Managed by neurology   Total encounter time more than 45 minutes  Greater than 50% was spent in counseling and coordination of care with the patient   Disposition:   F/U  3 months   No orders of the defined types were placed in this encounter.    Signed, Dossie Arbour, M.D., Ph.D. 03/16/2018  [facility] [place], [place] [phone]  This encounter was created in error - please disregard.

## 2018-03-18 ENCOUNTER — Encounter: Payer: Medicaid Other | Admitting: Cardiovascular Disease

## 2018-03-18 ENCOUNTER — Encounter: Payer: Self-pay | Admitting: Cardiovascular Disease

## 2018-05-11 NOTE — Progress Notes (Signed)
Cardiology Office Note  Date:  05/14/2018   ID:  Meghan Lloyd, DOB Feb 28, 1941, MRN [id number]  [facility]  [facility]   Chief Complaint  Patient presents with  . Other    3 month follow up. Patient c/o chest pain. Meds reviewed vebally with patient.     HPI:  77 y.o.femalewith a history of  Afib,  Dementia, syncope/orthostatic hypotension,  admitted 12/12 with syncope and rapid afib. Hospitalization December 2018 for orthostatic hypotension Who presents today for follow-up of her orthostatic hypotension, atrial fibrillation  presents today with caretaker, in a wheelchair On her last clinic visit she had worsening lower extremity edema Weight up 10 pounds , 180 pounds Former weight  166 Diltiazem was held and Lasix increased up to 40 mg daily from 20  On today's visit edema has resolved Weight on today's visit 133 pounds She denies any significant shortness of breath No recent lab work available   denies significant shortness of breath sedentary, no regular activity but reports she is walking more Denies abdominal bloating,  Tolerating metoprolol   EKG personally reviewed by myself on todays visit Shows atrial fibrillation with ventricular rate 85 bpm right bundle branch block   other past medical history reviewed Previously seen by neurology  on aricept, depakote, seoquel  admitted w/ recurrent syncope on 12/12 in setting of known orthostatic hypotension  Also in rapid afib on arrival.    CHA2DS2VASc = 5-6,  [facility].    PMH:   has a past medical history of Dementia, Diabetes mellitus without complication (HCC), Hypertension, Orthostatic hypotension, Persistent atrial fibrillation (HCC), and Syncope.  PSH:   History reviewed. No pertinent surgical history.  Current Outpatient Medications  Medication Sig Dispense Refill  . digoxin (LANOXIN) 0.125 MG tablet Take 0.125 mg by mouth daily.    Marland Kitchen. donepezil  (ARICEPT) 5 MG tablet Take 5 mg by mouth at bedtime.    . metFORMIN (GLUCOPHAGE) 1000 MG tablet Take 1,000 mg by mouth 2 (two) times daily with a meal.     . metoprolol succinate (TOPROL-XL) 25 MG 24 hr tablet Take 12.5 mg by mouth daily.    . potassium chloride SA (K-DUR,KLOR-CON) 20 MEQ tablet Take 1 tablet (20 mEq total) by mouth daily. 90 tablet 3  . pravastatin (PRAVACHOL) 80 MG tablet Take 1 tablet (80 mg total) by mouth daily. 90 tablet 3  . ranitidine (ZANTAC) 150 MG tablet Take 150 mg by mouth 2 (two) times daily.    Marland Kitchen. senna (SENOKOT) 8.6 MG TABS tablet Take 1 tablet by mouth daily.    . traZODone (DESYREL) 50 MG tablet Take 25 mg by mouth at bedtime.    . furosemide (LASIX) 40 MG tablet Take 1 tablet (40 mg total) by mouth 2 (two) times daily. 90 tablet 3   No current facility-administered medications for this visit.      Allergies:   Patient has no known allergies.   Social History:  The patient  reports that she has been smoking.  She has been smoking about 0.25 packs per day. She has never used smokeless tobacco. She reports that she drinks alcohol. She reports that she does not use drugs.   Family History:   Family history is unknown by patient.    Review of Systems: Review of Systems  Constitutional: Positive for weight loss.  Respiratory: Negative.   Cardiovascular: Negative.   Gastrointestinal: Negative.   Musculoskeletal: Negative.   Neurological: Negative.  Psychiatric/Behavioral: Negative.   All other systems reviewed and are negative.    PHYSICAL EXAM: VS:  BP (!) 108/50 (BP Location: Right Arm, Patient Position: Sitting, Cuff Size: Normal)   Pulse 85   Ht 5' 7.5" (1.715 m)   Wt 180 lb 8 oz (81.9 kg)   BMI 27.85 kg/m  , BMI Body mass index is 27.85 kg/m.   Constitutional:  oriented to person, place, and time. No distress.  HENT:  Head: Normocephalic and atraumatic.  Eyes:  no discharge. No scleral icterus.  Neck: Normal range of motion. Neck  supple. No JVD present.  Cardiovascular:irregularly irregular, normal heart sounds and intact distal pulses. Exam reveals no gallop and no friction rub.  No significant lower extremity edema Pulmonary/Chest: Effort normal and breath sounds normal. No stridor. No respiratory distress.  no wheezes.  no rales.  no tenderness.  Abdominal: Soft.  no distension.  no tenderness.  Musculoskeletal: Normal range of motion.  no  tenderness or deformity.  Neurological:  normal muscle tone. Coordination normal. No atrophy Skin: Skin is warm and dry. No rash noted. not diaphoretic.  Psychiatric:  normal mood and affect. behavior is normal. Thought content normal.   Recent Labs: 10/01/2017: TSH 0.867 02/13/2018: ALT 13; BUN 28; Creatinine, Ser 0.91; Hemoglobin 13.0; Platelets 120; Potassium 4.7; Sodium 140    Lipid Panel No results found for: CHOL, HDL, LDLCALC, TRIG    Wt Readings from Last 3 Encounters:  05/14/18 180 lb 8 oz (81.9 kg)  12/21/17 180 lb 8 oz (81.9 kg)  10/31/17 170 lb 4 oz (77.2 kg)       ASSESSMENT AND PLAN:  Atrial fibrillation with RVR (HCC) - Plan: EKG 12-Lead Continue metoprolol. On digoxin Not on anticoagulation given high fall risk Diltiazem held on previous office visit  Syncope due to orthostatic hypotension Previously on midodrine, not requiring this recently We will hold the diltiazem as above, increase metoprolol for rate control  Essential hypertension Blood pressure low but stable Recommend she decrease Lasix down to 20 mg daily  Lower extremity edema Concerning for chronic diastolic CHF Now resolved by holding diltiazem and a higher dose Lasix Recommended she decrease Lasix back to 20 mgrams daily with potassium 10 mEq  Type 2 diabetes mellitus without complication, without long-term current use of insulin (HCC) Managed by primary care Eight down to 133 pounds  Late onset Alzheimer's disease with behavioral disturbance Managed by neurology   Total  encounter time more than 25 minutes  Greater than 50% was spent in counseling and coordination of care with the patient   Disposition:   F/U  12 months as needed   Orders Placed This Encounter  Procedures  . EKG 12-Lead     Signed, Dossie Arbour, M.D., Ph.D. 05/14/2018  [facility] [place], [place] [phone]

## 2018-05-14 ENCOUNTER — Ambulatory Visit (INDEPENDENT_AMBULATORY_CARE_PROVIDER_SITE_OTHER): Payer: Medicare Other | Admitting: Cardiovascular Disease

## 2018-05-14 ENCOUNTER — Encounter: Payer: Self-pay | Admitting: Cardiovascular Disease

## 2018-05-14 VITALS — BP 108/50 | HR 85 | Ht 67.5 in | Wt 133.0 lb

## 2018-05-14 DIAGNOSIS — R600 Localized edema: Secondary | ICD-10-CM | POA: Diagnosis not present

## 2018-05-14 DIAGNOSIS — F02818 Dementia in other diseases classified elsewhere, unspecified severity, with other behavioral disturbance: Secondary | ICD-10-CM

## 2018-05-14 DIAGNOSIS — E119 Type 2 diabetes mellitus without complications: Secondary | ICD-10-CM

## 2018-05-14 DIAGNOSIS — I4891 Unspecified atrial fibrillation: Secondary | ICD-10-CM | POA: Diagnosis not present

## 2018-05-14 DIAGNOSIS — I5032 Chronic diastolic (congestive) heart failure: Secondary | ICD-10-CM | POA: Diagnosis not present

## 2018-05-14 DIAGNOSIS — F0281 Dementia in other diseases classified elsewhere with behavioral disturbance: Secondary | ICD-10-CM | POA: Diagnosis not present

## 2018-05-14 DIAGNOSIS — I951 Orthostatic hypotension: Secondary | ICD-10-CM | POA: Diagnosis not present

## 2018-05-14 DIAGNOSIS — G301 Alzheimer's disease with late onset: Secondary | ICD-10-CM | POA: Diagnosis not present

## 2018-05-14 DIAGNOSIS — I10 Essential (primary) hypertension: Secondary | ICD-10-CM

## 2018-05-14 NOTE — Patient Instructions (Addendum)
Medication Instructions:   Please decrease the lasix down to 20 mg daily  Decrease the potassium down to 10 mEq daily  Extra lasix 20 mg after lunch as needed for significant ankle swelling/leg swelling Take with extra potassium 10 mEq   Labwork:  No new labs needed  Testing/Procedures:  No further testing at this time   Follow-Up: It was a pleasure seeing you in the office today. Please call us if you have new issues that need to be addressed before your next appt.  [phone]  Your physician wants you to follow-up in: 12 months.  You will receive a reminder letter in the mail two months in advance. If you don't receive a letter, please call our office to schedule the follow-up appointment.  If you need a refill on your cardiac medications before your next appointment, please call your pharmacy.  For educational health videos Log in to : [email or web address] Or : [email or web address], password : [email or web address]

## 2018-06-03 ENCOUNTER — Encounter: Payer: Self-pay | Admitting: Oncology

## 2018-06-03 ENCOUNTER — Inpatient Hospital Stay: Payer: Medicare Other

## 2018-06-03 ENCOUNTER — Other Ambulatory Visit: Payer: Self-pay

## 2018-06-03 ENCOUNTER — Inpatient Hospital Stay: Payer: Medicare Other | Attending: Oncology | Admitting: Oncology

## 2018-06-03 VITALS — BP 94/57 | Temp 96.4°F | Resp 18

## 2018-06-03 DIAGNOSIS — E538 Deficiency of other specified B group vitamins: Secondary | ICD-10-CM | POA: Insufficient documentation

## 2018-06-03 DIAGNOSIS — D696 Thrombocytopenia, unspecified: Secondary | ICD-10-CM

## 2018-06-03 HISTORY — DX: Deficiency of other specified B group vitamins: E53.8

## 2018-06-03 LAB — COMPREHENSIVE METABOLIC PANEL
ALBUMIN: 4.1 g/dL (ref 3.5–5.0)
ALT: 13 U/L (ref 0–44)
ANION GAP: 10 (ref 5–15)
AST: 19 U/L (ref 15–41)
Alkaline Phosphatase: 36 U/L — ABNORMAL LOW (ref 38–126)
BILIRUBIN TOTAL: 0.6 mg/dL (ref 0.3–1.2)
BUN: 25 mg/dL — ABNORMAL HIGH (ref 8–23)
CO2: 24 mmol/L (ref 22–32)
Calcium: 9.1 mg/dL (ref 8.9–10.3)
Chloride: 107 mmol/L (ref 98–111)
Creatinine, Ser: 1.3 mg/dL — ABNORMAL HIGH (ref 0.44–1.00)
GFR calc Af Amer: 44 mL/min — ABNORMAL LOW (ref >60)
GFR, EST NON AFRICAN AMERICAN: 38 mL/min — AB (ref >60)
GLUCOSE: 141 mg/dL — AB (ref 70–99)
POTASSIUM: 4.2 mmol/L (ref 3.5–5.1)
Sodium: 141 mmol/L (ref 135–145)
TOTAL PROTEIN: 6.8 g/dL (ref 6.5–8.1)

## 2018-06-03 LAB — VITAMIN B12: Vitamin B-12: 142 pg/mL — ABNORMAL LOW (ref 180–914)

## 2018-06-03 LAB — CBC WITH DIFFERENTIAL/PLATELET
BASOS ABS: 0 10*3/uL (ref 0–0.1)
Basophils Relative: 0 %
Eosinophils Absolute: 0.1 10*3/uL (ref 0–0.7)
Eosinophils Relative: 2 %
HEMATOCRIT: 37.6 % (ref 35.0–47.0)
Hemoglobin: 12.8 g/dL (ref 12.0–16.0)
LYMPHS PCT: 33 %
Lymphs Abs: 1.7 10*3/uL (ref 1.0–3.6)
MCH: 32.7 pg (ref 26.0–34.0)
MCHC: 34 g/dL (ref 32.0–36.0)
MCV: 96.2 fL (ref 80.0–100.0)
Monocytes Absolute: 0.3 10*3/uL (ref 0.2–0.9)
Monocytes Relative: 7 %
NEUTROS ABS: 2.9 10*3/uL (ref 1.4–6.5)
NEUTROS PCT: 58 %
Platelets: 114 10*3/uL — ABNORMAL LOW (ref 150–440)
RBC: 3.91 MIL/uL (ref 3.80–5.20)
RDW: 13.7 % (ref 11.5–14.5)
WBC: 5 10*3/uL (ref 3.6–11.0)

## 2018-06-03 LAB — LACTATE DEHYDROGENASE: LDH: 103 U/L (ref 98–192)

## 2018-06-03 LAB — TECHNOLOGIST SMEAR REVIEW: TECH REVIEW: NORMAL

## 2018-06-03 LAB — FOLATE: Folate: 6.5 ng/mL (ref >5.9)

## 2018-06-03 NOTE — Progress Notes (Signed)
Patient here for initial evaluation. Able to answer questions, but could not state year or month. Medication reconciliation done through facility medication list. Caregiver from [facility] present.

## 2018-06-03 NOTE — Progress Notes (Signed)
Hematology/Oncology Consult note [facility] Telephone:([phone] Fax:(336) [phone]   Patient Care Team: Raynelle Bring as PCP - General Mariah Milling Tollie Pizza, MD as PCP - Cardiology (Cardiology)  REFERRING PROVIDER: Raynelle Bring  CHIEF COMPLAINTS/REASON FOR VISIT:  Evaluation of thrombocytopenia  HISTORY OF PRESENTING ILLNESS:  Meghan Lloyd is a  77 y.o.  female with PMH listed below who was referred to me for evaluation of thrombocytopenia. Patient has chronic dementia and pleasantly confused.  Currently living [facility] nursing home facility.  Patient recently had lab work done which revealed thrombocytopenia, platelet counts 120,000.   Reviewed patient's previous labs, thrombocytopenia duration is chronic, dated back to December 2018 No aggravating or improving factors.  Not associated with easy bruising, hematochezia hemoptysis melena.  Associated symptoms: Patient denies fatigue, weight loss, easy bruising, hematochezia, hemoptysis.  History hepatitis or HIV infection. History of chronic liver disease; denies Alcohol consumption: Not currently using any alcohol.  Review of Systems  Unable to perform ROS: Dementia  Constitutional: Positive for malaise/fatigue. Negative for chills and fever.  HENT: Negative for nosebleeds.   Eyes: Negative for redness.  Respiratory: Negative for cough.   Gastrointestinal: Negative for abdominal pain, nausea and vomiting.  Genitourinary: Negative for dysuria.  Skin: Negative for itching and rash.  Endo/Heme/Allergies: Does not bruise/bleed easily.    MEDICAL HISTORY:  Past Medical History:  Diagnosis Date  . Dementia   . Diabetes mellitus without complication (HCC)   . GERD (gastroesophageal reflux disease)   . Hyperlipidemia   . Hypertension   . Insomnia   . Mood disorder (HCC)   . Orthostatic hypotension   . Persistent atrial fibrillation (HCC)    a. CHA2DS2VASc = 5-->No OAC in  setting of dementia, ? compliance, falls.  . Syncope    a. likely 2/2 orthostasis;  b. 09/2017 Echo: Ef 50-55%, no rwma, mild AI/MR, mod dil LA/RA, mod TR, PASP ; c. 09/2017 Carotid U/S: <50% bilat ICA stenosis.    SURGICAL HISTORY: History reviewed. No pertinent surgical history.  SOCIAL HISTORY: Social History   Socioeconomic History  . Marital status: Widowed    Spouse name: Not on file  . Number of children: Not on file  . Years of education: Not on file  . Highest education level: Not on file  Occupational History  . Not on file  Social Needs  . Financial resource strain: Not on file  . Food insecurity:    Worry: Not on file    Inability: Not on file  . Transportation needs:    Medical: Not on file    Non-medical: Not on file  Tobacco Use  . Smoking status: Current Every Day Smoker    Packs/day: 0.25  . Smokeless tobacco: Never Used  . Tobacco comment: Patietn is at a smoke free facility but states she smokes  Substance and Sexual Activity  . Alcohol use: Yes    Comment: occasionally  . Drug use: No  . Sexual activity: Never  Lifestyle  . Physical activity:    Days per week: Not on file    Minutes per session: Not on file  . Stress: Not on file  Relationships  . Social connections:    Talks on phone: Not on file    Gets together: Not on file    Attends religious service: Not on file    Active member of club or organization: Not on file    Attends meetings of clubs or organizations: Not on file  Relationship status: Not on file  . Intimate partner violence:    Fear of current or ex partner: Not on file    Emotionally abused: Not on file    Physically abused: Not on file    Forced sexual activity: Not on file  Other Topics Concern  . Not on file  Social History Narrative   [place] in [place] Dtr.    FAMILY HISTORY: Family History  Problem Relation Age of Onset  . Cancer Mother   . Cancer Father     ALLERGIES:  has No Known  Allergies.  MEDICATIONS:  Current Outpatient Medications  Medication Sig Dispense Refill  . acetaminophen (TYLENOL) 325 MG tablet Take 650 mg by mouth every 8 (eight) hours as needed.    . Cholecalciferol (VITAMIN D3) 2000 units TABS Take by mouth.    . digoxin (LANOXIN) 0.125 MG tablet Take 0.125 mg by mouth daily.    Marland Kitchen. donepezil (ARICEPT) 5 MG tablet Take 5 mg by mouth at bedtime.    . metFORMIN (GLUCOPHAGE) 1000 MG tablet Take 1,000 mg by mouth 2 (two) times daily with a meal.     . metoprolol succinate (TOPROL-XL) 25 MG 24 hr tablet Take 12.5 mg by mouth daily.    . mirtazapine (REMERON) 7.5 MG tablet Take 7.5 mg by mouth at bedtime.    . potassium chloride SA (K-DUR,KLOR-CON) 10 MEQ tablet Take 1 tablet (10 mEq total) by mouth daily.    . pravastatin (PRAVACHOL) 80 MG tablet Take 1 tablet (80 mg total) by mouth daily. 90 tablet 3  . ranitidine (ZANTAC) 150 MG tablet Take 150 mg by mouth 2 (two) times daily.    Marland Kitchen. senna (SENOKOT) 8.6 MG TABS tablet Take 1 tablet by mouth daily.    . traZODone (DESYREL) 50 MG tablet Take 25 mg by mouth at bedtime.    . furosemide (LASIX) 40 MG tablet Take 1 tablet (40 mg total) by mouth 2 (two) times daily. 90 tablet 3   No current facility-administered medications for this visit.      PHYSICAL EXAMINATION: ECOG PERFORMANCE STATUS: 2 - Symptomatic, <50% confined to bed Vitals:   06/03/18 0929  BP: (!) 94/57  Resp: 18  Temp: (!) 96.4 F (35.8 C)   Filed Weights    Physical Exam  Constitutional: She is oriented to person, place, and time. She appears well-developed and well-nourished. No distress.  HENT:  Head: Normocephalic and atraumatic.  Right Ear: External ear normal.  Left Ear: External ear normal.  Mouth/Throat: Oropharynx is clear and moist.  Eyes: Pupils are equal, round, and reactive to light. EOM are normal. No scleral icterus.  Neck: Normal range of motion. Neck supple.  Cardiovascular: Normal rate, regular rhythm and normal  heart sounds.  Pulmonary/Chest: Effort normal. No respiratory distress. She has no wheezes.  Abdominal: Soft. Bowel sounds are normal. She exhibits no distension and no mass. There is no tenderness.  Musculoskeletal: Normal range of motion. She exhibits no edema or deformity.  Neurological: She is alert and oriented to person, place, and time. No cranial nerve deficit. Coordination normal.  Skin: Skin is warm and dry. No rash noted. No erythema.  Psychiatric: She has a normal mood and affect. Her behavior is normal. Thought content normal.     LABORATORY DATA:  I have reviewed the data as listed Lab Results  Component Value Date   WBC 5.0 06/03/2018   HGB 12.8 06/03/2018   HCT 37.6 06/03/2018   MCV 96.2  06/03/2018   PLT 114 (L) 06/03/2018   Recent Labs    10/10/17 1150  10/11/17 0556 02/13/18 1135 06/03/18 1039  NA 136   < > 137 140 141  K 4.4   < > 4.1 4.7 4.2  CL 103   < > 103 101 107  CO2 26   < > 26 31 24   GLUCOSE 251*   < > 280* 209* 141*  BUN 29*   < > 28* 28* 25*  CREATININE 1.17*   < > 1.04* 0.91 1.30*  CALCIUM 8.9   < > 9.0 8.8* 9.1  GFRNONAA 44*   < > 50* 59* 38*  GFRAA 51*   < > 59* >60 44*  PROT 6.7  --   --  6.4* 6.8  ALBUMIN 3.9  --   --  3.4* 4.1  AST 21  --   --  19 19  ALT 21  --   --  13* 13  ALKPHOS 60  --   --  55 36*  BILITOT 1.0  --   --  0.7 0.6   < > = values in this interval not displayed.   Iron/TIBC/Ferritin/ %Sat No results found for: IRON, TIBC, FERRITIN, IRONPCTSAT      ASSESSMENT & PLAN:  1. Thrombocytopenia (HCC)   2. B12 deficiency    For the work up of patient's thrombocytopenia, I recommend checking CBC;CMP, LDH; smear review, folate, Vitamin B12, hepatitis, HIV,   flowcytometry and monoclonal gammopathy workup. Will also check ultrasound of the abdomen.  Labs reviewed. Patient has B12 deficiency which may contribute to her thrombocytopenia.  Will arrange patient to receive parental B12 injection  # Patient follow-up with  me in approximately 2 weeks to review the above results.  Orders Placed This Encounter  Procedures  . US SPLEEN (ABDOMEN LIMITED)    Standing Status:   Future    Standing Expiration Date:   08/04/2019    Order Specific Question:   Reason for Exam (SYMPTOM  OR DIAGNOSIS REQUIRED)    Answer:   thrombocytopenia    Order Specific Question:   Preferred imaging location?    Answer:   [facility]  . CBC with Differential/Platelet    Standing Status:   Future    Number of Occurrences:   1    Standing Expiration Date:   06/04/2019  . Comprehensive metabolic panel    Standing Status:   Future    Number of Occurrences:   1    Standing Expiration Date:   06/04/2019  . Lactate dehydrogenase    Standing Status:   Future    Number of Occurrences:   1    Standing Expiration Date:   06/04/2019  . Hepatitis panel, acute    Standing Status:   Future    Number of Occurrences:   1    Standing Expiration Date:   06/04/2019  . HIV antibody    Standing Status:   Future    Number of Occurrences:   1    Standing Expiration Date:   06/04/2019  . Folate    Standing Status:   Future    Number of Occurrences:   1    Standing Expiration Date:   06/04/2019  . Vitamin B12    Standing Status:   Future    Number of Occurrences:   1    Standing Expiration Date:   06/04/2019  . Flow cytometry panel-leukemia/lymphoma work-up    Standing Status:  Future    Number of Occurrences:   1    Standing Expiration Date:   06/04/2019  . Technologist smear review    Standing Status:   Future    Number of Occurrences:   1    Standing Expiration Date:   06/04/2019  . Protein electrophoresis, serum    Standing Status:   Future    Number of Occurrences:   1    Standing Expiration Date:   06/04/2019    All questions were answered. The patient knows to call the clinic with any problems questions or concerns.  Return of visit: 2 weeks Thank you for this kind referral and the opportunity to participate in the care of this patient. A  copy of today's note is routed to referring provider  Total face to face encounter time for this patient visit was 45 min. >50% of the time was  spent in counseling and coordination of care.    Rickard Patience, MD, PhD Hematology Oncology [facility] at [facility] Pager- [phone] 06/03/2018

## 2018-06-04 LAB — PROTEIN ELECTROPHORESIS, SERUM
A/G Ratio: 1.3 (ref 0.7–1.7)
ALBUMIN ELP: 3.6 g/dL (ref 2.9–4.4)
ALPHA-1-GLOBULIN: 0.2 g/dL (ref 0.0–0.4)
Alpha-2-Globulin: 0.8 g/dL (ref 0.4–1.0)
Beta Globulin: 0.9 g/dL (ref 0.7–1.3)
GLOBULIN, TOTAL: 2.7 g/dL (ref 2.2–3.9)
Gamma Globulin: 0.8 g/dL (ref 0.4–1.8)
TOTAL PROTEIN ELP: 6.3 g/dL (ref 6.0–8.5)

## 2018-06-04 LAB — HEPATITIS PANEL, ACUTE
HCV Ab: <0.1 s/co ratio (ref 0.0–0.9)
HEP B C IGM: NEGATIVE
Hep A IgM: NEGATIVE
Hepatitis B Surface Ag: NEGATIVE

## 2018-06-04 LAB — HIV ANTIBODY (ROUTINE TESTING W REFLEX): HIV Screen 4th Generation wRfx: NONREACTIVE

## 2018-06-05 ENCOUNTER — Other Ambulatory Visit: Payer: Self-pay | Admitting: Oncology

## 2018-06-05 LAB — COMP PANEL: LEUKEMIA/LYMPHOMA

## 2018-06-05 MED ORDER — CYANOCOBALAMIN 1000 MCG/ML IJ SOLN: INTRAMUSCULAR | 0 refills | Status: DC

## 2018-06-06 ENCOUNTER — Telehealth: Payer: Self-pay | Admitting: *Deleted

## 2018-06-06 NOTE — Telephone Encounter (Signed)
Meghan Lloyd with [facility] called and states she got a call from our office yesterday regarding orders for injections to be given to patient. We were to fax orders and lab results to her and she never received anything from us Please fax orders and labs to [phone] or [phone]

## 2018-06-06 NOTE — Telephone Encounter (Signed)
Yes, it's in a folder on my desk, I will have Dr Cathie HoopsYu sign when we get back in the office today and fax to them

## 2018-06-06 NOTE — Telephone Encounter (Signed)
Prescription has been faxed.

## 2018-06-07 ENCOUNTER — Ambulatory Visit
Admission: RE | Admit: 2018-06-07 | Discharge: 2018-06-07 | Disposition: A | Discharge status: Home / Self Care | Payer: Medicare Other | Source: Ambulatory Visit | Attending: Oncology | Admitting: Oncology

## 2018-06-07 DIAGNOSIS — D696 Thrombocytopenia, unspecified: Secondary | ICD-10-CM | POA: Diagnosis not present

## 2018-06-12 ENCOUNTER — Ambulatory Visit: Payer: Medicare Other

## 2018-06-17 ENCOUNTER — Other Ambulatory Visit: Payer: Self-pay

## 2018-06-17 ENCOUNTER — Inpatient Hospital Stay (HOSPITAL_BASED_OUTPATIENT_CLINIC_OR_DEPARTMENT_OTHER): Payer: Medicare Other | Admitting: Oncology

## 2018-06-17 ENCOUNTER — Encounter: Payer: Self-pay | Admitting: Oncology

## 2018-06-17 VITALS — BP 96/55 | HR 81 | Temp 96.6°F | Resp 18 | Wt 133.6 lb

## 2018-06-17 DIAGNOSIS — D696 Thrombocytopenia, unspecified: Secondary | ICD-10-CM | POA: Diagnosis not present

## 2018-06-17 DIAGNOSIS — E538 Deficiency of other specified B group vitamins: Secondary | ICD-10-CM | POA: Diagnosis not present

## 2018-06-17 MED ORDER — CYANOCOBALAMIN 1000 MCG/ML IJ SOLN: 1000.0000 ug | INTRAMUSCULAR | 2 refills | Status: AC

## 2018-06-17 NOTE — Progress Notes (Signed)
Patient here follow up. Daughter-in-law [name] present.

## 2018-06-17 NOTE — Progress Notes (Signed)
Hematology/Oncology follow up note [facility] Telephone:(336) [phone] Fax:(336) [phone]   Patient Care Team: Katheren Shams as PCP - General Rockey Situ Kathlene November, MD as PCP - Cardiology (Cardiology)  REFERRING PROVIDER: Katheren Shams  REASON FOR VISIT Follow up for treatment of thrombocytopenia  HISTORY OF PRESENTING ILLNESS:  Meghan Lloyd is a  77 y.o.  female with PMH listed below who was referred to me for evaluation of thrombocytopenia. Patient has chronic dementia and pleasantly confused.  Currently living [facility] home facility.  Patient recently had lab work done which revealed thrombocytopenia, platelet counts 120,000.   Reviewed patient's previous labs, thrombocytopenia duration is chronic, dated back to December 2018 No aggravating or improving factors.  Not associated with easy bruising, hematochezia hemoptysis melena.  Associated symptoms: Patient denies fatigue, weight loss, easy bruising, hematochezia, hemoptysis.  History hepatitis or HIV infection. History of chronic liver disease; denies Alcohol consumption: Not currently using any alcohol.  INTERVAL HISTORY Meghan Lloyd is a 77 y.o. female who has above history reviewed by me today presents for follow up visit for thrombocytopenia.  During the interval patient has had lab work-up and ultrasound spleen.  Ultrasound spleen was independently reviewed by me.  Normal size of spleen. B12 level was low.  So I advised the patient to start on parenteral B12 injections 1000 MCG daily for 5 days followed by 1000 MCG weekly x4.  Patient has started B12 injections at the nursing home. Patient reports feeling okay today.  No new complaints.  Accompanied by daughter-in-law.   Review of Systems  Unable to perform ROS: Dementia  Constitutional: Positive for malaise/fatigue. Negative for chills, fever and weight loss.  HENT: Negative for nosebleeds.   Eyes: Negative for double  vision and photophobia.  Cardiovascular: Negative for chest pain, palpitations and orthopnea.  Gastrointestinal: Negative for blood in stool and nausea.  Genitourinary: Negative for dysuria.  Skin: Negative for itching and rash.  Neurological: Negative for focal weakness.  Endo/Heme/Allergies: Does not bruise/bleed easily.    MEDICAL HISTORY:  Past Medical History:  Diagnosis Date  . B12 deficiency 06/03/2018  . Dementia   . Diabetes mellitus without complication ([place])   . GERD (gastroesophageal reflux disease)   . Hyperlipidemia   . Hypertension   . Insomnia   . Mood disorder ([place])   . Orthostatic hypotension   . Persistent atrial fibrillation (HCC)    a. CHA2DS2VASc = 5-->No OAC in setting of dementia, ? compliance, falls.  . Syncope    a. likely 2/2 orthostasis;  b. 09/2017 Echo: Ef 50-55%, no rwma, mild AI/MR, mod dil LA/RA, mod TR, PASP 70mHg; c. 09/2017 Carotid U/S: <50% bilat ICA stenosis.    SURGICAL HISTORY: History reviewed. No pertinent surgical history.  SOCIAL HISTORY: Social History   Socioeconomic History  . Marital status: Widowed    Spouse name: Not on file  . Number of children: Not on file  . Years of education: Not on file  . Highest education level: Not on file  Occupational History  . Not on file  Social Needs  . Financial resource strain: Not on file  . Food insecurity:    Worry: Not on file    Inability: Not on file  . Transportation needs:    Medical: Not on file    Non-medical: Not on file  Tobacco Use  . Smoking status: Current Every Day Smoker    Packs/day: 0.25  . Smokeless tobacco: Never Used  . Tobacco comment: Patietn is  at a smoke free facility but states she smokes  Substance and Sexual Activity  . Alcohol use: Yes    Comment: occasionally  . Drug use: No  . Sexual activity: Never  Lifestyle  . Physical activity:    Days per week: Not on file    Minutes per session: Not on file  . Stress: Not on file  Relationships  .  Social connections:    Talks on phone: Not on file    Gets together: Not on file    Attends religious service: Not on file    Active member of club or organization: Not on file    Attends meetings of clubs or organizations: Not on file    Relationship status: Not on file  . Intimate partner violence:    Fear of current or ex partner: Not on file    Emotionally abused: Not on file    Physically abused: Not on file    Forced sexual activity: Not on file  Other Topics Concern  . Not on file  Social History Narrative   Lives in [place] with Dtr.    FAMILY HISTORY: Family History  Problem Relation Age of Onset  . Cancer Mother   . Cancer Father     ALLERGIES:  has No Known Allergies.  MEDICATIONS:  Current Outpatient Medications  Medication Sig Dispense Refill  . acetaminophen (TYLENOL) 325 MG tablet Take 650 mg by mouth every 8 (eight) hours as needed.    . Cholecalciferol (VITAMIN D3) 2000 units TABS Take by mouth.    . cyanocobalamin (,VITAMIN B-12,) 1000 MCG/ML injection Inject 1061mg/1mL into skin daily for 5 days, then weekly x 4 9 mL 0  . digoxin (LANOXIN) 0.125 MG tablet Take 0.125 mg by mouth daily.    .Marland Kitchendonepezil (ARICEPT) 5 MG tablet Take 5 mg by mouth at bedtime.    . furosemide (LASIX) 40 MG tablet Take 1 tablet (40 mg total) by mouth 2 (two) times daily. 90 tablet 3  . metFORMIN (GLUCOPHAGE) 1000 MG tablet Take 1,000 mg by mouth 2 (two) times daily with a meal.     . metoprolol succinate (TOPROL-XL) 25 MG 24 hr tablet Take 12.5 mg by mouth daily.    . mirtazapine (REMERON) 7.5 MG tablet Take 7.5 mg by mouth at bedtime.    . potassium chloride SA (K-DUR,KLOR-CON) 10 MEQ tablet Take 1 tablet (10 mEq total) by mouth daily.    . pravastatin (PRAVACHOL) 80 MG tablet Take 1 tablet (80 mg total) by mouth daily. 90 tablet 3  . ranitidine (ZANTAC) 150 MG tablet Take 150 mg by mouth 2 (two) times daily.    .Marland Kitchensenna (SENOKOT) 8.6 MG TABS tablet Take 1 tablet by mouth daily.      . traZODone (DESYREL) 50 MG tablet Take 25 mg by mouth at bedtime.     No current facility-administered medications for this visit.      PHYSICAL EXAMINATION: ECOG PERFORMANCE STATUS: 2 - Symptomatic, <50% confined to bed Vitals:   06/17/18 0859  BP: (!) 96/55  Pulse: 81  Resp: 18  Temp: (!) 96.6 F (35.9 C)   Filed Weights   06/17/18 0859  Weight: 133 lb 9.6 oz (60.6 kg)    Physical Exam  Constitutional: She is oriented to person, place, and time. She appears well-developed and well-nourished. No distress.  Frail appearance elderly female sitting in a wheelchair.  HENT:  Head: Normocephalic and atraumatic.  Right Ear: External ear normal.  Left  Ear: External ear normal.  Mouth/Throat: Oropharynx is clear and moist.  Eyes: Pupils are equal, round, and reactive to light. EOM are normal. No scleral icterus.  Neck: Normal range of motion. Neck supple.  Cardiovascular: Normal rate, regular rhythm and normal heart sounds.  Pulmonary/Chest: Effort normal. No respiratory distress. She has no wheezes.  Abdominal: Soft. Bowel sounds are normal. She exhibits no distension and no mass. There is no tenderness.  Musculoskeletal: Normal range of motion. She exhibits no edema or deformity.  Neurological: She is alert and oriented to person, place, and time. No cranial nerve deficit. Coordination normal.  Skin: Skin is warm and dry. No rash noted. No erythema.  Psychiatric: She has a normal mood and affect. Her behavior is normal. Thought content normal.     LABORATORY DATA:  I have reviewed the data as listed Lab Results  Component Value Date   WBC 5.0 06/03/2018   HGB 12.8 06/03/2018   HCT 37.6 06/03/2018   MCV 96.2 06/03/2018   PLT 114 (L) 06/03/2018   Recent Labs    10/10/17 1150  10/11/17 0556 02/13/18 1135 06/03/18 1039  NA 136   < > 137 140 141  K 4.4   < > 4.1 4.7 4.2  CL 103   < > 103 101 107  CO2 26   < > '26 31 24  ' GLUCOSE 251*   < > 280* 209* 141*  BUN 29*    < > 28* 28* 25*  CREATININE 1.17*   < > 1.04* 0.91 1.30*  CALCIUM 8.9   < > 9.0 8.8* 9.1  GFRNONAA 44*   < > 50* 59* 38*  GFRAA 51*   < > 59* >60 44*  PROT 6.7  --   --  6.4* 6.8  ALBUMIN 3.9  --   --  3.4* 4.1  AST 21  --   --  19 19  ALT 21  --   --  13* 13  ALKPHOS 60  --   --  55 36*  BILITOT 1.0  --   --  0.7 0.6   < > = values in this interval not displayed.   Iron/TIBC/Ferritin/ %Sat No results found for: IRON, TIBC, FERRITIN, IRONPCTSAT      ASSESSMENT & PLAN:  1. Thrombocytopenia ([place])   2. B12 deficiency   Labs reviewed and discussed with patient and her daughter-in-law. Negative hepatitis, HIV, negative flow cytometry.  SPEP negative for monoclonal protein spikes.  Normal folic acid level. Low B12 level consistent with B12 deficiency. Advised patient to continue finish course of daily B12 injection x5 followed by weekly B12 injection for 4. After she completes weekly injection course, recommend continue B12 injection monthly.  #I discussed with the patient and her daughter-in-law that patient's thrombocytopenia can be secondary to B12 deficiency. Plan repeat B12 levels in 3 months. If thrombocytopenia persistently low despite replete of B12 level, she may have underlying bone marrow disorders, i.e. MDS I recommend close monitoring her counts.  I will hold bone marrow biopsy evaluation for now as most likely is not going to change her treatment given her advanced age and multiple comorbidities.  Patient and daughter-in-law voiced understanding and agree with the plan.   Orders Placed This Encounter  Procedures  . CBC with Differential/Platelet    Standing Status:   Future    Standing Expiration Date:   06/18/2019  . Vitamin B12    Standing Status:   Future    Standing Expiration  Date:   12/18/2018    All questions were answered. The patient knows to call the clinic with any problems questions or concerns.  Return of visit: 3 months Total face to face encounter  time for this patient visit was 15 min. >50% of the time was  spent in counseling and coordination of care.   Earlie Server, MD, PhD Hematology Oncology [facility] at [facility] Pager- [phone] 06/17/2018

## 2018-09-13 ENCOUNTER — Other Ambulatory Visit: Payer: Medicare Other

## 2018-09-16 ENCOUNTER — Other Ambulatory Visit: Payer: Self-pay

## 2018-09-16 ENCOUNTER — Encounter: Payer: Self-pay | Admitting: Oncology

## 2018-09-16 ENCOUNTER — Inpatient Hospital Stay: Payer: Medicare Other | Attending: Oncology | Admitting: Oncology

## 2018-09-16 VITALS — BP 121/63 | HR 85 | Temp 96.8°F | Resp 18 | Wt 151.9 lb

## 2018-09-16 DIAGNOSIS — D696 Thrombocytopenia, unspecified: Secondary | ICD-10-CM | POA: Insufficient documentation

## 2018-09-16 DIAGNOSIS — E538 Deficiency of other specified B group vitamins: Secondary | ICD-10-CM | POA: Diagnosis not present

## 2018-09-16 NOTE — Progress Notes (Signed)
Patient here for follow up. Accompanied by Cheryln [place]White oak staff member.

## 2018-09-18 NOTE — Progress Notes (Signed)
Hematology/Oncology follow up note [facility] [phone] [phone]   Patient Care Team: Katheren Shams as PCP - General Rockey Situ Kathlene November, MD as PCP - Cardiology (Cardiology)  REFERRING PROVIDER: Katheren Shams  REASON FOR VISIT Follow up for treatment of thrombocytopenia  HISTORY OF PRESENTING ILLNESS:  Meghan Lloyd is a  77 y.o.  female with PMH listed below who was referred to me for evaluation of thrombocytopenia. Patient has chronic dementia and pleasantly confused.  Currently living [facility].  Patient recently had lab work done which revealed thrombocytopenia, platelet counts 120,000.   Reviewed patient's previous labs, thrombocytopenia duration is chronic, dated back to December 2018 No aggravating or improving factors.  Not associated with easy bruising, hematochezia hemoptysis melena.  Associated symptoms: Patient denies fatigue, weight loss, easy bruising, hematochezia, hemoptysis.  History hepatitis or HIV infection. History of chronic liver disease; denies Alcohol consumption: Not currently using any alcohol.   #  Ultrasound spleen was independently reviewed by me.  Normal size of spleen.  INTERVAL HISTORY Meghan Lloyd is a 77 y.o. female who has above history reviewed by me today presents for follow-up visit for thrombocytopenia.  She has B12 deficiency and has received parenteral B12 injection at nursing home. She is supposed to have a B12 level checked prior to this visit at nursing home. Appears that she only had CBC done this time. CBC 09/13/2018 at [facility] showed WBC 4.1, hemoglobin 12.1, MCV 94.3, reticulocyte count 79,000, normal differential.  Patient was accompanied by nursing home facility staff. Patient has underlying dementia, poor historian.  Denies any bleeding history.  Feeling tired.  Daughter-in-law Lattie Haw is power of attorney who could not  accompany patient to this visit.   Review of Systems  Unable to perform ROS: Dementia  Constitutional: Positive for malaise/fatigue. Negative for chills, fever and weight loss.  HENT: Negative for nosebleeds.   Eyes: Negative for double vision and photophobia.  Cardiovascular: Negative for chest pain, palpitations and orthopnea.  Gastrointestinal: Negative for blood in stool and nausea.  Genitourinary: Negative for dysuria.  Skin: Negative for itching and rash.  Neurological: Negative for focal weakness.  Endo/Heme/Allergies: Does not bruise/bleed easily.    MEDICAL HISTORY:  Past Medical History:  Diagnosis Date  . B12 deficiency 06/03/2018  . Dementia ([place])   . Diabetes mellitus without complication ([place])   . GERD (gastroesophageal reflux disease)   . Hyperlipidemia   . Hypertension   . Insomnia   . Mood disorder ([place])   . Orthostatic hypotension   . Persistent atrial fibrillation    a. CHA2DS2VASc = 5-->No OAC in setting of dementia, ? compliance, falls.  . Syncope    a. likely 2/2 orthostasis;  b. 09/2017 Echo: Ef 50-55%, no rwma, mild AI/MR, mod dil LA/RA, mod TR, PASP 86mHg; c. 09/2017 Carotid U/S: <50% bilat ICA stenosis.    SURGICAL HISTORY: History reviewed. No pertinent surgical history.  SOCIAL HISTORY: Social History   Socioeconomic History  . Marital status: Widowed    Spouse name: Not on file  . Number of children: Not on file  . Years of education: Not on file  . Highest education level: Not on file  Occupational History  . Not on file  Social Needs  . Financial resource strain: Not on file  . Food insecurity:    Worry: Not on file    Inability: Not on file  . Transportation needs:    Medical: Not on file  Non-medical: Not on file  Tobacco Use  . Smoking status: Current Every Day Smoker    Packs/day: 0.25  . Smokeless tobacco: Never Used  . Tobacco comment: Patietn is at a smoke free facility but states she smokes  Substance and Sexual  Activity  . Alcohol use: Yes    Comment: occasionally  . Drug use: No  . Sexual activity: Never  Lifestyle  . Physical activity:    Days per week: Not on file    Minutes per session: Not on file  . Stress: Not on file  Relationships  . Social connections:    Talks on phone: Not on file    Gets together: Not on file    Attends religious service: Not on file    Active member of club or organization: Not on file    Attends meetings of clubs or organizations: Not on file    Relationship status: Not on file  . Intimate partner violence:    Fear of current or ex partner: Not on file    Emotionally abused: Not on file    Physically abused: Not on file    Forced sexual activity: Not on file  Other Topics Concern  . Not on file  Social History Narrative   Lives in [place] with Dtr.    FAMILY HISTORY: Family History  Problem Relation Age of Onset  . Cancer Mother   . Cancer Father     ALLERGIES:  has No Known Allergies.  MEDICATIONS:  Current Outpatient Medications  Medication Sig Dispense Refill  . acetaminophen (TYLENOL) 325 MG tablet Take 650 mg by mouth every 8 (eight) hours as needed.    . Cholecalciferol (VITAMIN D3) 2000 units TABS Take by mouth.    . cyanocobalamin (,VITAMIN B-12,) 1000 MCG/ML injection Inject 1 mL (1,000 mcg total) into the muscle every 30 (thirty) days. Beginning once rx for wkly has completed; approx 07/09/18 1 mL 2  . digoxin (LANOXIN) 0.125 MG tablet Take 0.125 mg by mouth daily.    Marland Kitchen donepezil (ARICEPT) 5 MG tablet Take 5 mg by mouth at bedtime.    . magnesium oxide-pyridoxine (BEELITH) 362-20 MG TABS tablet Take 1 tablet by mouth daily.    . metoprolol succinate (TOPROL-XL) 25 MG 24 hr tablet Take 12.5 mg by mouth daily.    . mirtazapine (REMERON) 7.5 MG tablet Take 7.5 mg by mouth at bedtime.    . potassium chloride SA (K-DUR,KLOR-CON) 10 MEQ tablet Take 1 tablet (10 mEq total) by mouth daily.    . pravastatin (PRAVACHOL) 80 MG tablet Take 1  tablet (80 mg total) by mouth daily. 90 tablet 3  . ranitidine (ZANTAC) 150 MG tablet Take 150 mg by mouth 2 (two) times daily.    Marland Kitchen senna (SENOKOT) 8.6 MG TABS tablet Take 1 tablet by mouth daily.    . traZODone (DESYREL) 50 MG tablet Take 25 mg by mouth at bedtime.    . cyanocobalamin (,VITAMIN B-12,) 1000 MCG/ML injection Inject 1020mg/1mL into skin daily for 5 days, then weekly x 4 (Patient not taking: Reported on 09/16/2018) 9 mL 0  . furosemide (LASIX) 40 MG tablet Take 1 tablet (40 mg total) by mouth 2 (two) times daily. 90 tablet 3  . metFORMIN (GLUCOPHAGE) 1000 MG tablet Take 1,000 mg by mouth 2 (two) times daily with a meal.      No current facility-administered medications for this visit.      PHYSICAL EXAMINATION: ECOG PERFORMANCE STATUS: 2 - Symptomatic, <50%  confined to bed Vitals:   09/16/18 1413  BP: 121/63  Pulse: 85  Resp: 18  Temp: (!) 96.8 F (36 C)   Filed Weights   09/16/18 1413  Weight: 151 lb 14.4 oz (68.9 kg)    Physical Exam  Constitutional: She is oriented to person, place, and time. No distress.  Frail appearance elderly female sitting in a wheelchair.  HENT:  Head: Normocephalic and atraumatic.  Mouth/Throat: Oropharynx is clear and moist.  Eyes: Pupils are equal, round, and reactive to light. EOM are normal. No scleral icterus.  Neck: Normal range of motion. Neck supple.  Cardiovascular: Normal rate, regular rhythm and normal heart sounds.  Pulmonary/Chest: Effort normal. No respiratory distress. She has no wheezes.  Abdominal: Soft. Bowel sounds are normal. She exhibits no distension and no mass. There is no tenderness.  Musculoskeletal: Normal range of motion. She exhibits no edema or deformity.  Neurological: She is alert and oriented to person, place, and time. No cranial nerve deficit. Coordination normal.  Skin: Skin is warm and dry. No rash noted. No erythema.  Psychiatric:  Alert and orientated x2.(Name and place)     LABORATORY  DATA:  I have reviewed the data as listed Lab Results  Component Value Date   WBC 5.0 06/03/2018   HGB 12.8 06/03/2018   HCT 37.6 06/03/2018   MCV 96.2 06/03/2018   PLT 114 (L) 06/03/2018   Recent Labs    10/10/17 1150  10/11/17 0556 02/13/18 1135 06/03/18 1039  NA 136   < > 137 140 141  K 4.4   < > 4.1 4.7 4.2  CL 103   < > 103 101 107  CO2 26   < > '26 31 24  ' GLUCOSE 251*   < > 280* 209* 141*  BUN 29*   < > 28* 28* 25*  CREATININE 1.17*   < > 1.04* 0.91 1.30*  CALCIUM 8.9   < > 9.0 8.8* 9.1  GFRNONAA 44*   < > 50* 59* 38*  GFRAA 51*   < > 59* >60 44*  PROT 6.7  --   --  6.4* 6.8  ALBUMIN 3.9  --   --  3.4* 4.1  AST 21  --   --  19 19  ALT 21  --   --  13* 13  ALKPHOS 60  --   --  55 36*  BILITOT 1.0  --   --  0.7 0.6   < > = values in this interval not displayed.   Negative hepatitis, HIV, negative flow cytometry.  SPEP negative for monoclonal protein spikes.  Normal folic acid level.   ASSESSMENT & PLAN:  1. Thrombocytopenia ([place])   2. B12 deficiency    Labs reviewed and discussed with patient and nursing home facility staff. Despite receiving B12 injections, patient continues to have low platelet counts, her platelet counts actually has decreased from last visit. Differential includes ITP/autoimmune disorder related/bone marrow disorder. ?  MDS Patient does not have decision capacity. I called patient's POA Lisa and discussed with her over the phone. Given patient's multiple comorbidities and declining condition, Lattie Haw prefers no aggressive diagnostic testing including bone marrow biopsy for definitive diagnosis for now.  Continue supportive care. Monitoring counts every 3 months.  If her platelet counts dropped below 30,000, I will give her a trial of oral steroids.  #Vitamin B12 deficiency, will obtain B12 level Prescription of labs I  spent sufficient time to discuss many aspect of care  with patient's POA,  questions were answered tohersatisfaction. Return of  visit: 3 months Total face to face encounter time for this patient visit was 4mn. >50% of the time was  spent in counseling and coordination of care.   ZEarlie Server MD, PhD Hematology Oncology [facility]rist[facility]at [facility] HealthPager- [phone]/20/2019

## 2018-10-08 ENCOUNTER — Encounter: Payer: Self-pay | Admitting: Family Medicine

## 2018-12-03 IMAGING — CT CT HEAD W/O CM
3 series · 15 of 47 positions shown, 18 images · non-contrast
Comparison: CT scan of September 30, 2017.

CLINICAL DATA: Dizziness.  Possible head injury.

EXAM:
CT HEAD WITHOUT CONTRAST
TECHNIQUE: Contiguous axial images were obtained from the base of the skull
through the vertex without intravenous contrast.

[Series 3: head wo · axial · 0.42mm/px · z∈[-137,-12]mm · 9 of 30 slices shown, 12 images]
[im 3/30  brain]
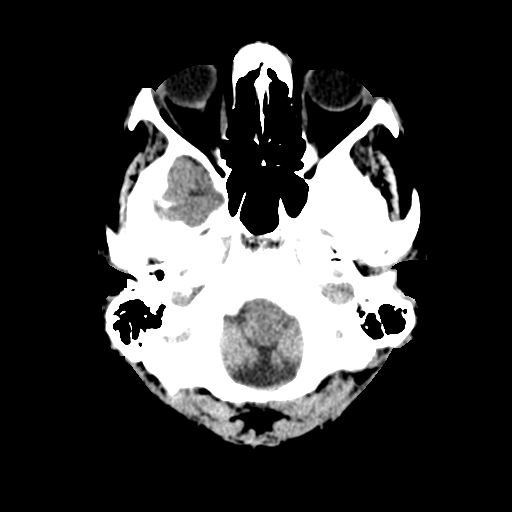
[im 3/30  bone]
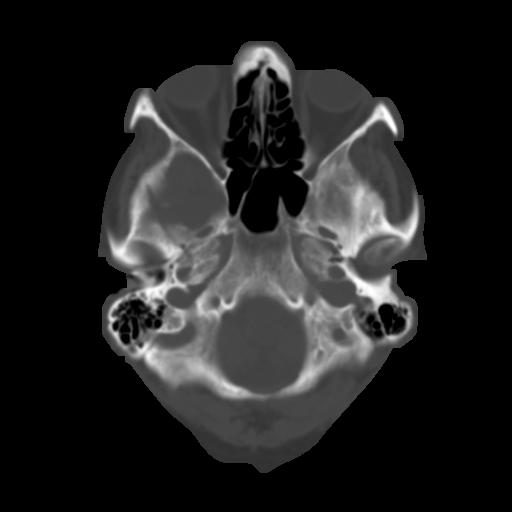
[im 6/30  brain]
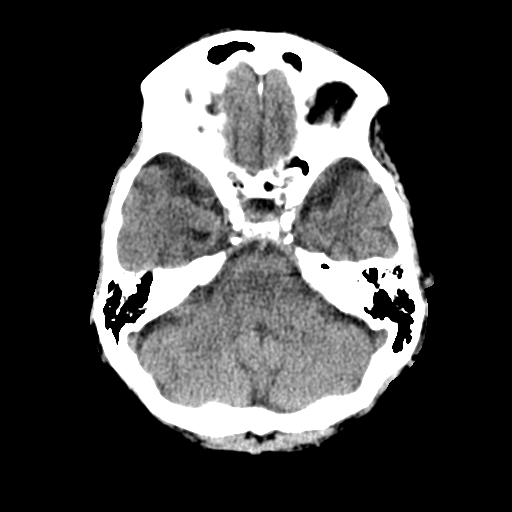
[im 9/30  brain]
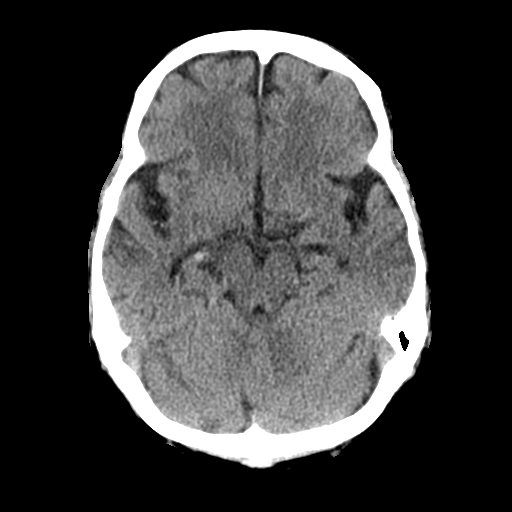
[im 12/30  brain]
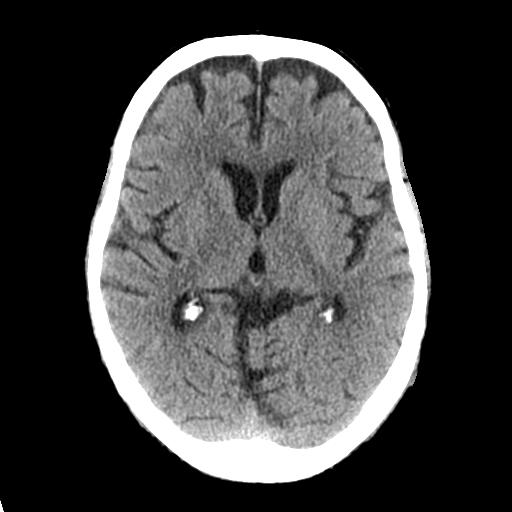
[im 16/30  brain]
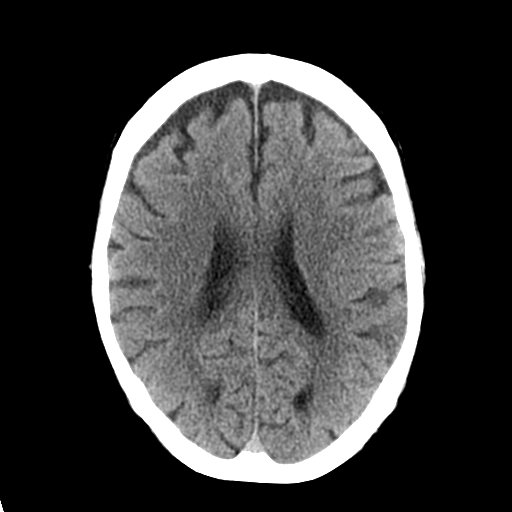
[im 16/30  bone]
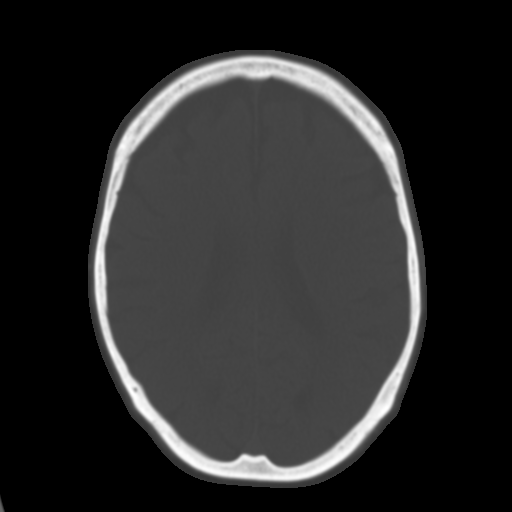
[im 19/30  brain]
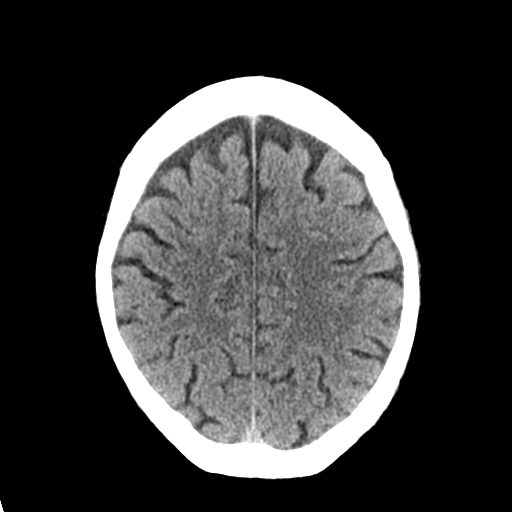
[im 22/30  brain]
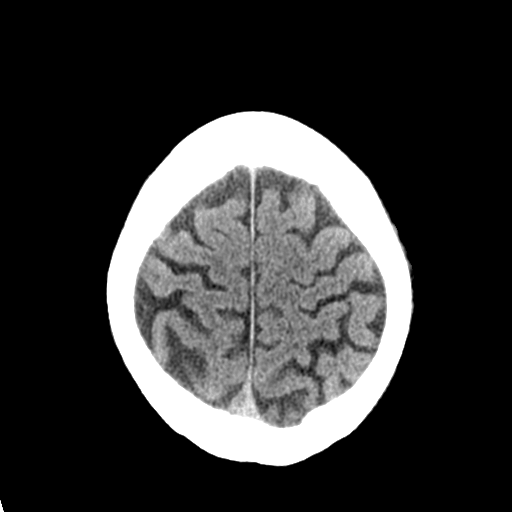
[im 25/30  brain]
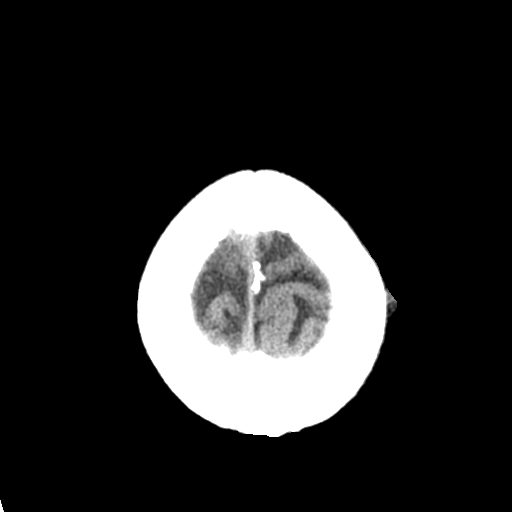
[im 28/30  brain]
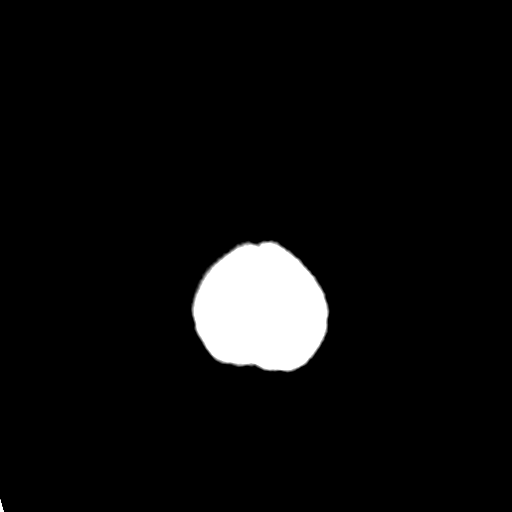
[im 28/30  bone]
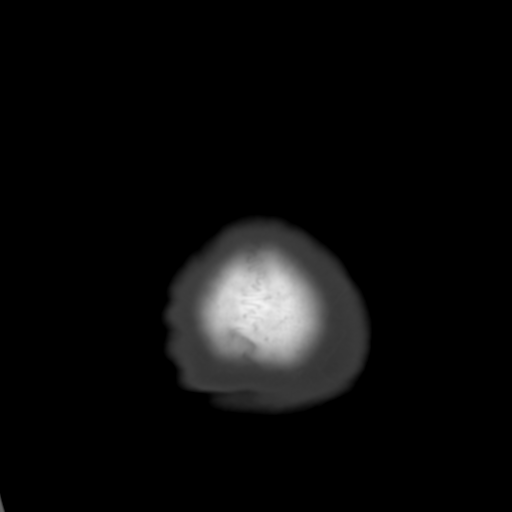

[Series 4: coronal soft tissue · coronal · 0.29mm/px · 3 of 66 slices shown]
[im 22/66  brain]
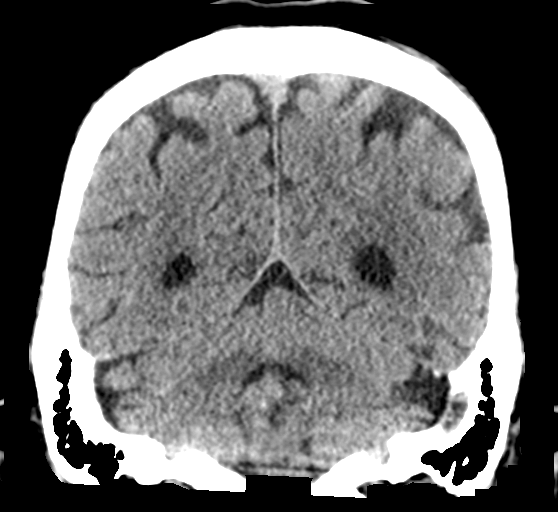
[im 29/66  brain]
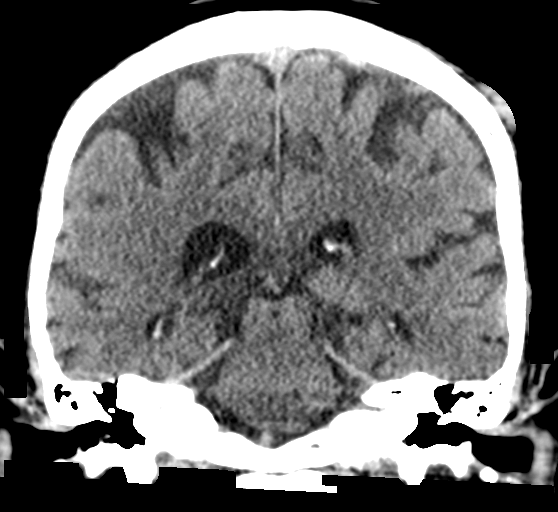
[im 37/66  brain]
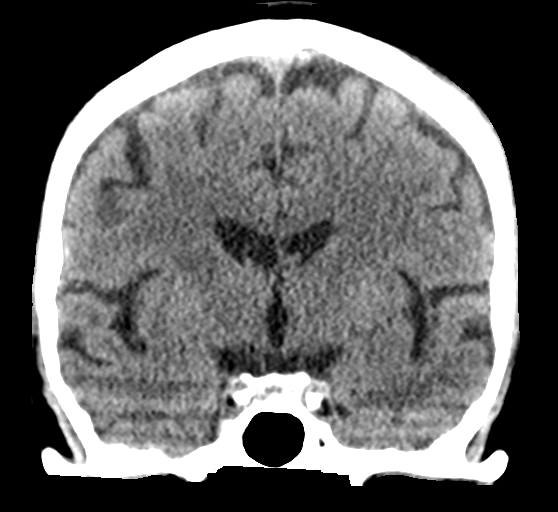

[Series 5: sagittal soft tissue · sagittal · 0.27mm/px · 3 of 51 slices shown]
[im 17/51  brain]
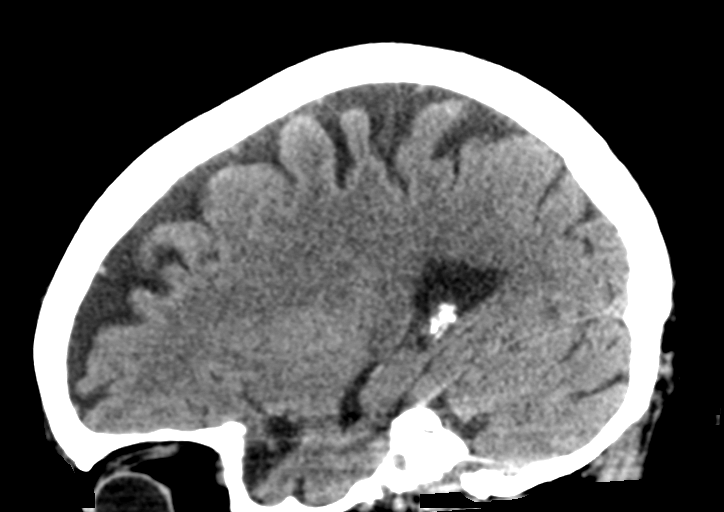
[im 26/51  brain]
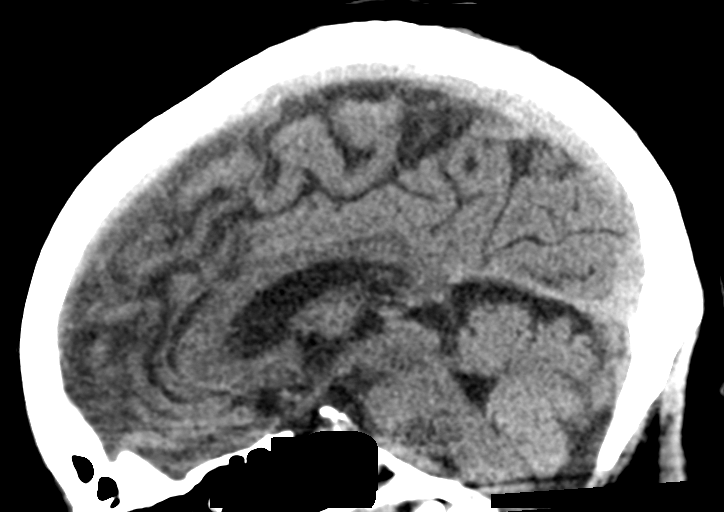
[im 34/51  brain]
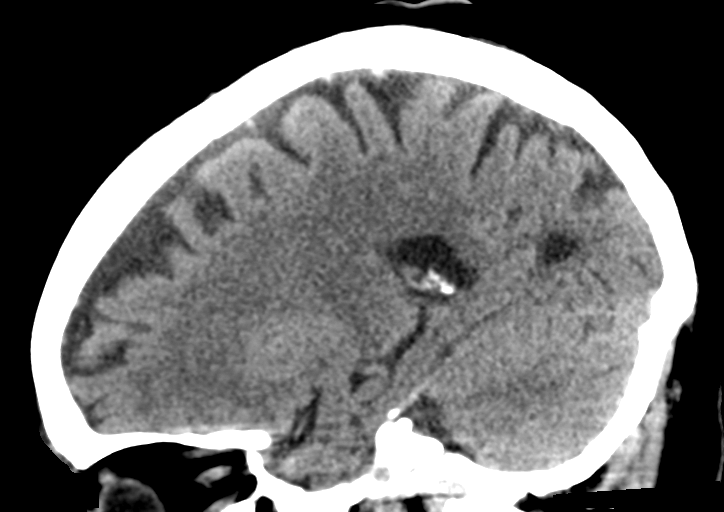

[15 of 47 positions shown; findings below may reference images not displayed]

FINDINGS: Brain: Mild diffuse cortical atrophy is noted. Mild chronic ischemic
white matter disease is noted. No mass effect or midline shift is
noted. Ventricular size is within normal limits. There is no
evidence of mass lesion, hemorrhage or acute infarction.

Vascular: No hyperdense vessel or unexpected calcification.

Skull: Normal. Negative for fracture or focal lesion.

Sinuses/Orbits: No acute finding.

Other: Small left parietal scalp hematoma is noted.
IMPRESSION: Small left parietal scalp hematoma. Mild diffuse cortical atrophy.
Mild chronic ischemic white matter disease. No acute intracranial
abnormality seen.

## 2018-12-03 IMAGING — CT CT HEAD W/O CM
5 of 7 series · 17 of 47 positions shown, 18 images · non-contrast
Comparison: 10/10/2017

CLINICAL DATA: Status post 2 falls today.

EXAM:
CT HEAD WITHOUT CONTRAST
CT CERVICAL SPINE WITHOUT CONTRAST
TECHNIQUE: Multidetector CT imaging of the head and cervical spine was
performed following the standard protocol without intravenous
contrast. Multiplanar CT image reconstructions of the cervical spine
were also generated.

[Series 2: head wo · axial · 0.43mm/px · z∈[-88,-38]mm · 2 of 31 slices shown, 3 images]
[im 11/31  brain]
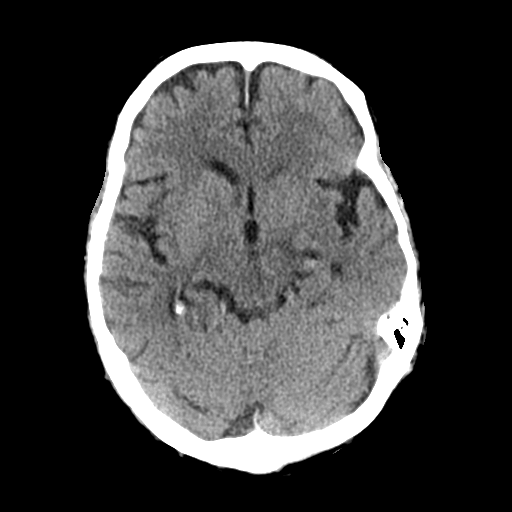
[im 11/31  bone]
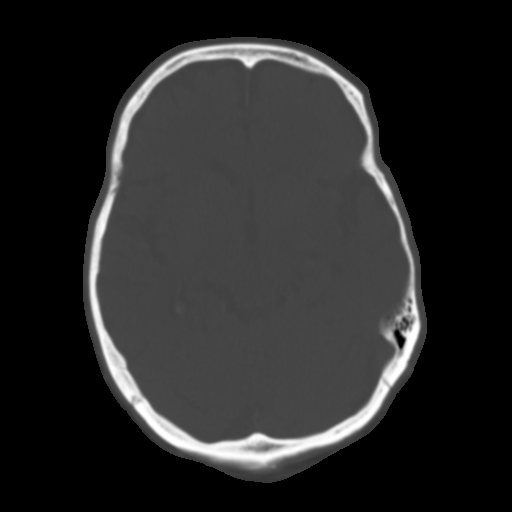
[im 21/31  brain]
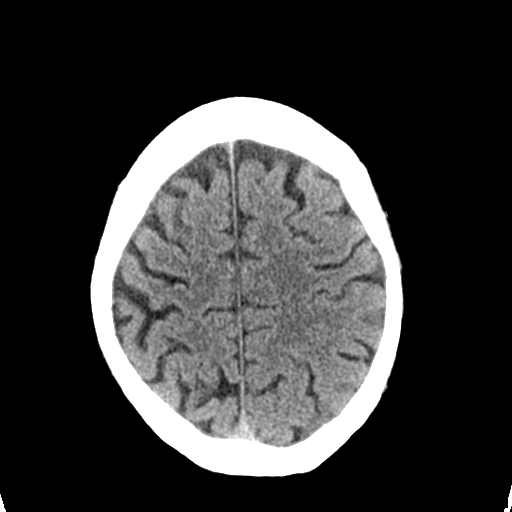

[Series 4: coronal soft tissue · coronal · 0.33mm/px · 3 of 65 slices shown]
[im 22/65  brain]
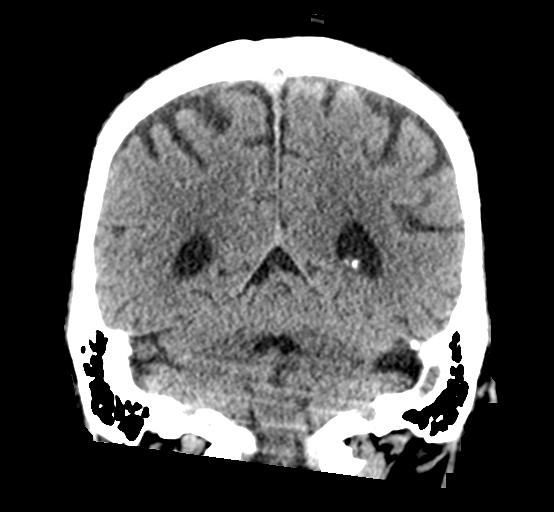
[im 33/65  brain]
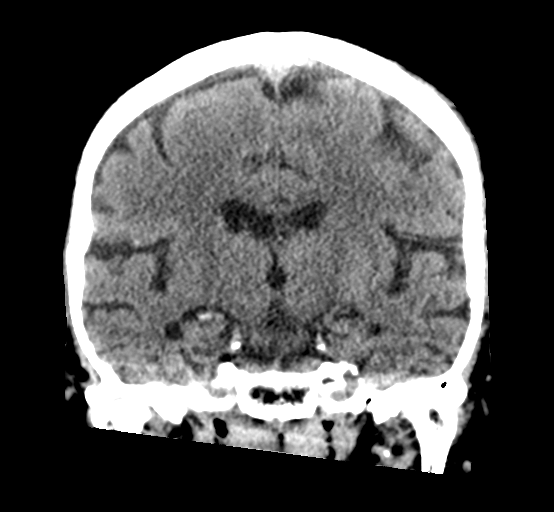
[im 43/65  brain]
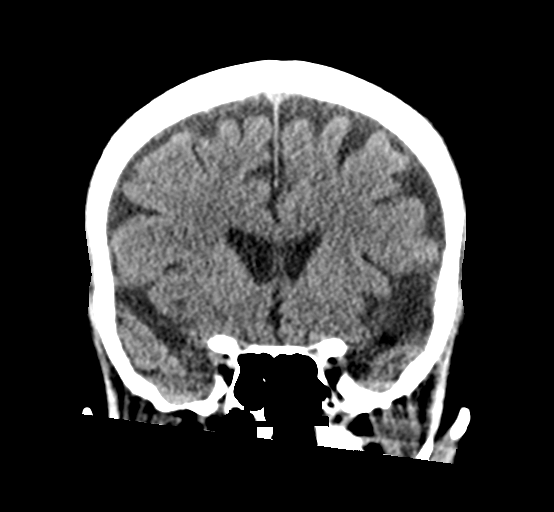

[Series 5: sagittal soft tissue · sagittal · 0.34mm/px · 1 of 51 slices shown]
[im 26/51  brain]
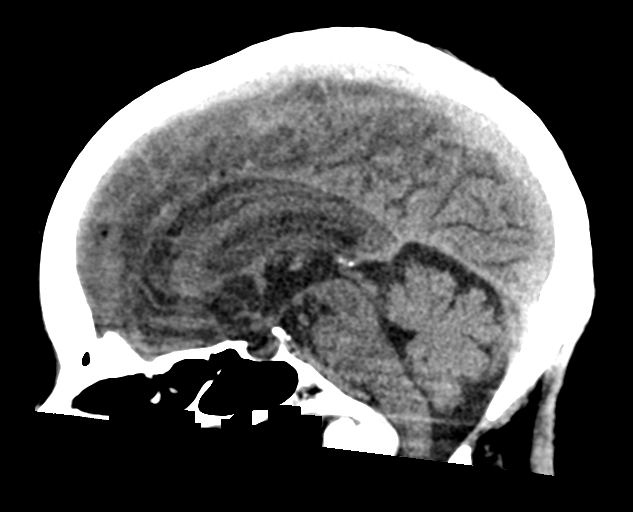

[Series 7: c spine soft · axial · 0.33mm/px · z∈[-294,-242]mm · 3 of 95 slices shown]
[im 9/95  brain]
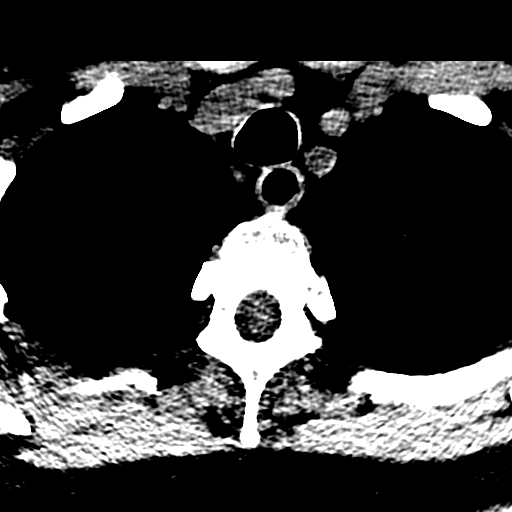
[im 18/95  brain]
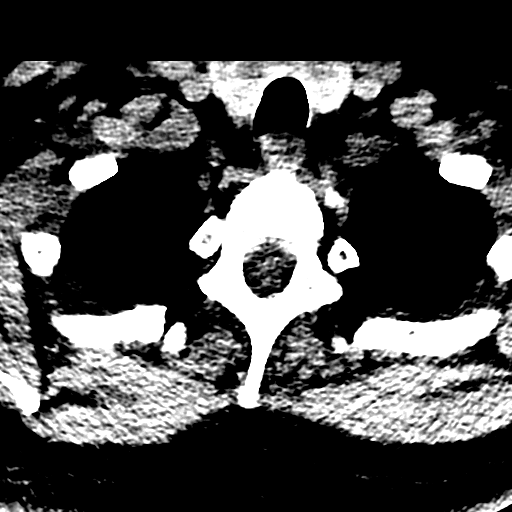
[im 35/95  brain]
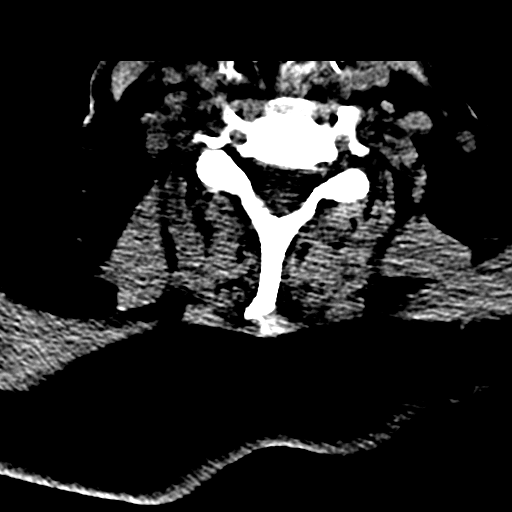

[Series 12: orthogonal bone · axial · 0.26mm/px · z∈[-328,-164]mm · 8 of 106 slices shown]
[im 9/106  bone]
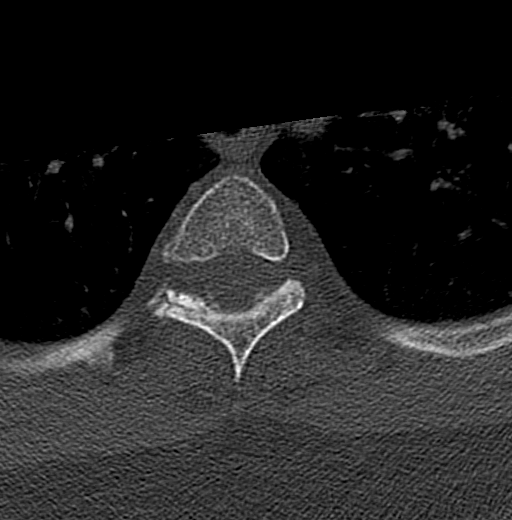
[im 25/106  bone]
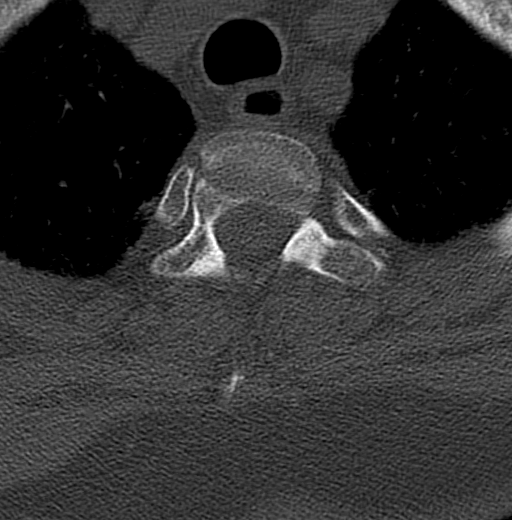
[im 33/106  bone]
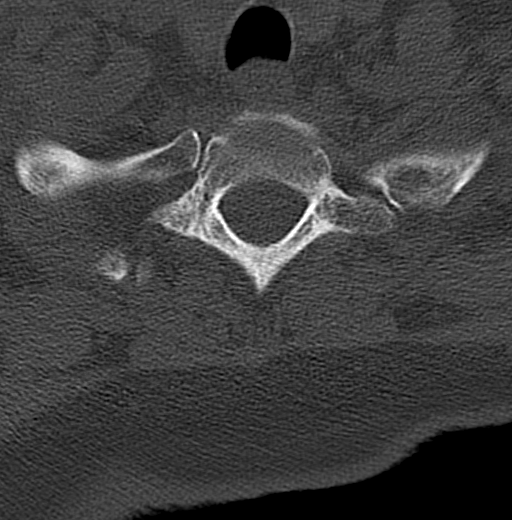
[im 49/106  bone]
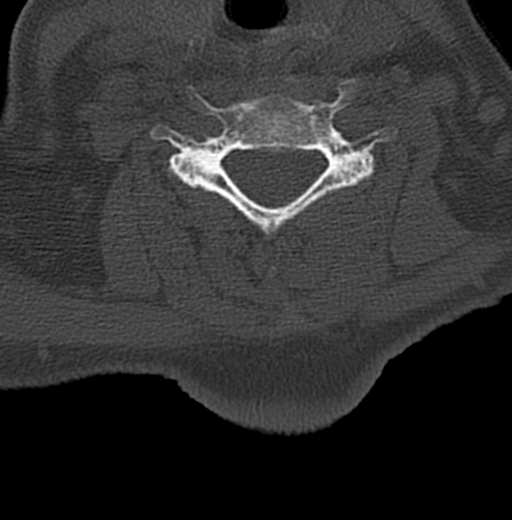
[im 57/106  bone]
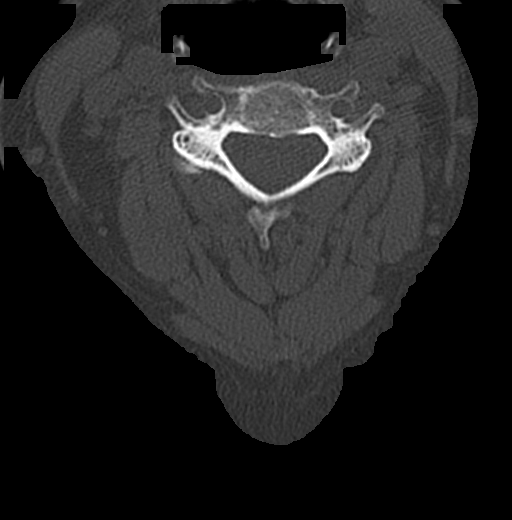
[im 73/106  bone]
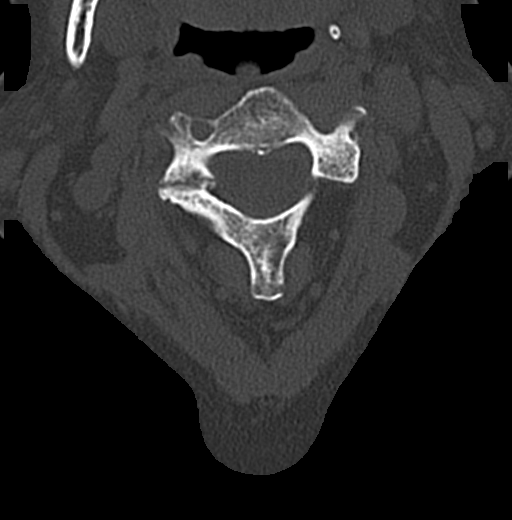
[im 81/106  bone]
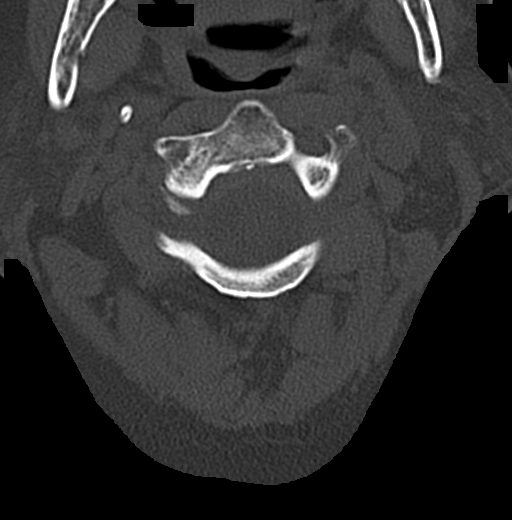
[im 97/106  bone]
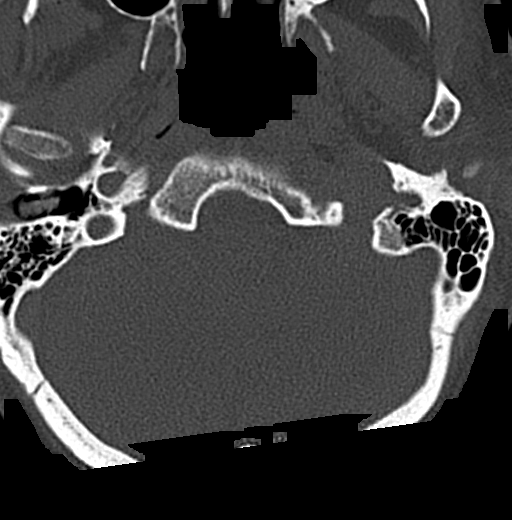

[17 of 47 positions shown; findings below may reference images not displayed]

FINDINGS: CT HEAD FINDINGS

Brain: No evidence of acute infarction, hemorrhage, hydrocephalus,
extra-axial collection or mass lesion/mass effect. Mild brain
parenchymal volume loss and periventricular microangiopathy.

Vascular: Calcific atherosclerotic disease of intra cavernous
internal carotid arteries.

Skull: Normal. Negative for fracture or focal lesion.

Sinuses/Orbits: Dense opacification of the left maxillary sinus with
high density material with periosteal thickening in the sinus walls,
evidence of chronic sinusitis.

Other: Left parietal scalp hematoma.

CT CERVICAL SPINE FINDINGS

Alignment: Normal.

Skull base and vertebrae: No acute fracture. No primary bone lesion
or focal pathologic process.

Soft tissues and spinal canal: No prevertebral fluid or swelling. No
visible canal hematoma.

Disc levels: Multilevel osteoarthritic changes, with prominent
posterior facet arthropathy.

Upper chest: Right apical subpleural scarring. 7 mm nodular area in
the right apex may represent scarring versus a pulmonary nodule,
image 83/95, sequence 6.

Other: None.
IMPRESSION: No acute intracranial abnormality.

No evidence of acute traumatic injury to the cervical spine.

Chronic left maxillary sinusitis.

7 mm pulmonary nodule versus pleuroparenchymal scarring in the right
lung apex. Non-contrast chest CT at 6-12 months is recommended. If
the nodule is stable at time of repeat CT, then future CT at 18-24
months (from today's scan) is considered optional for low-risk
patients, but is recommended for high-risk patients. This
recommendation follows the consensus statement: Guidelines for
Management of Incidental Pulmonary Nodules Detected on CT Images:

## 2018-12-04 IMAGING — CR DG SHOULDER 2+V*R*
1 series · 4 of 4 positions shown · non-contrast
Comparison: 10/10/2017

CLINICAL DATA: Right shoulder pain after falling again today

EXAM:
RIGHT SHOULDER - 2+ VIEW

[Series 1: dg shoulder right · 0.14mm/px · 4 of 4 slices shown]
[im 1/4]
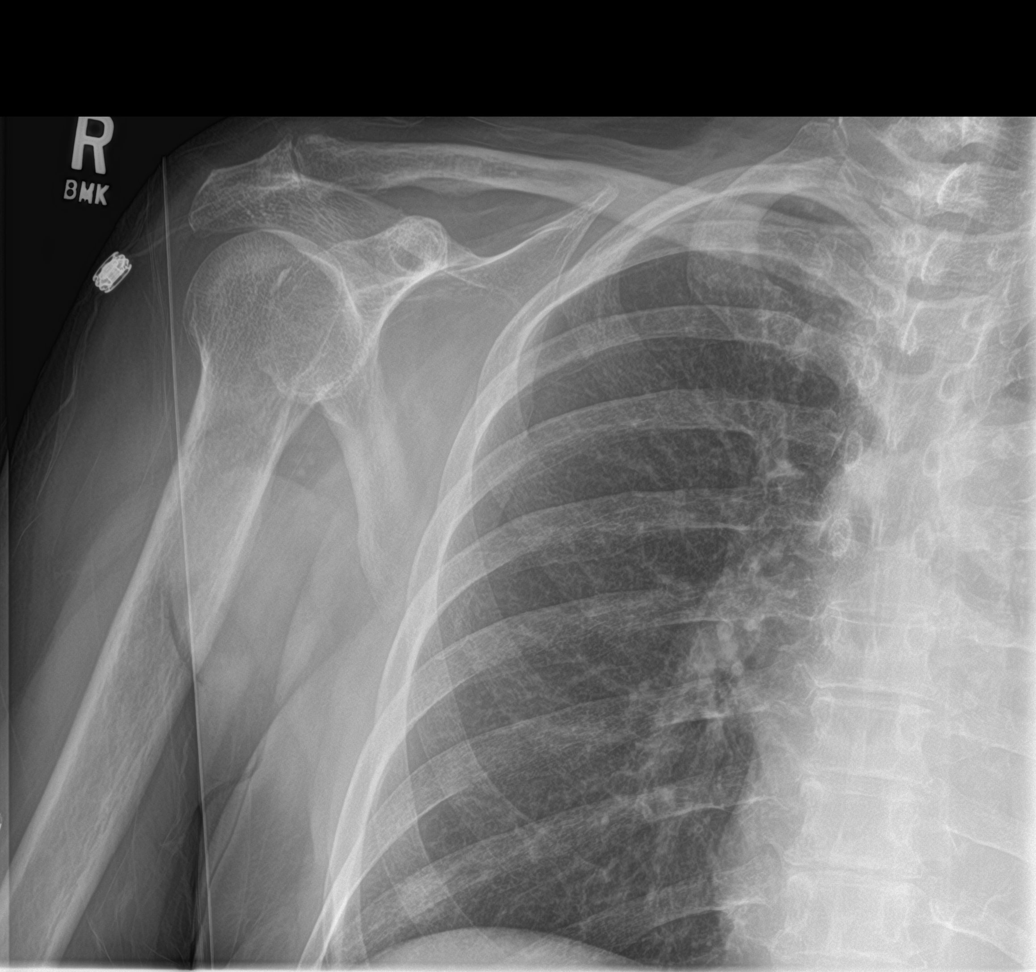
[im 2/4]
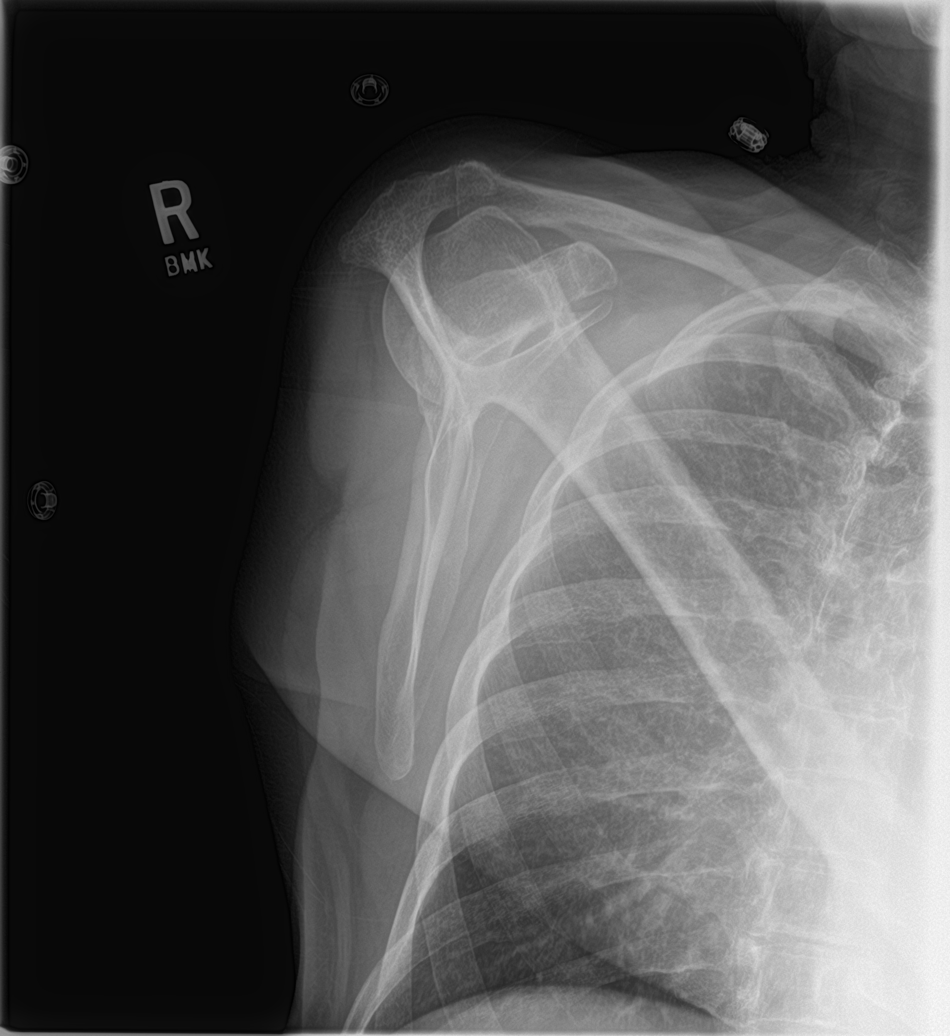
[im 3/4]
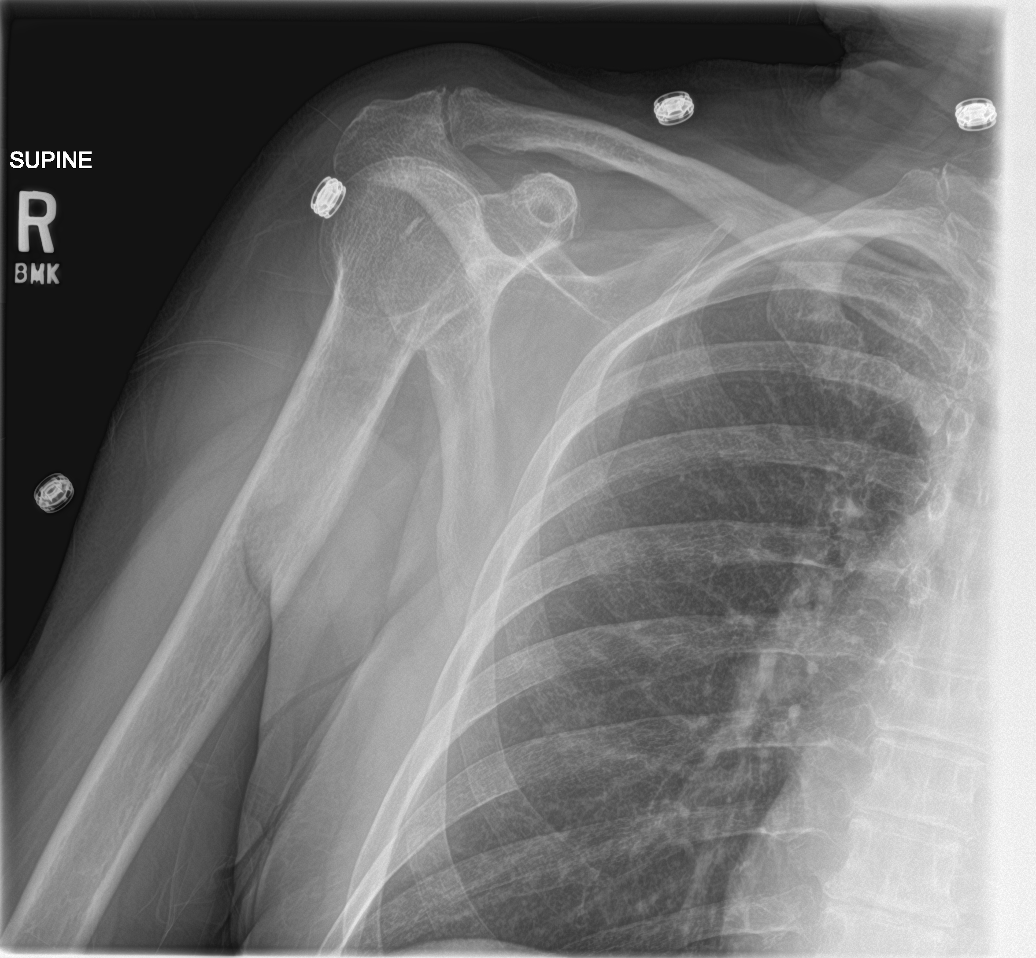
[im 4/4]
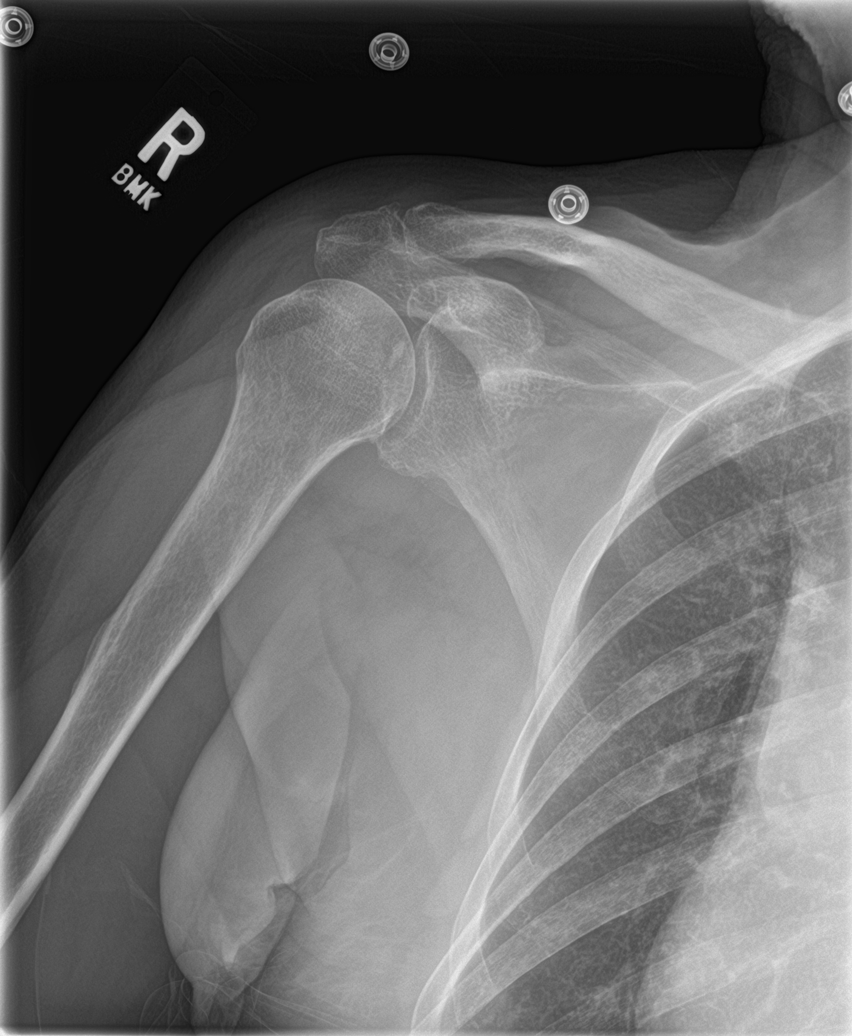

[4 of 4 positions shown; findings below may reference images not displayed]

FINDINGS: Negative for acute fracture or dislocation. Mild AC degenerative
changes. Visible portions of the right ribs are intact.
IMPRESSION: Negative.

## 2018-12-16 ENCOUNTER — Inpatient Hospital Stay: Payer: Medicare Other | Admitting: Oncology

## 2019-01-14 ENCOUNTER — Inpatient Hospital Stay: Payer: Medicare Other | Admitting: Oncology

## 2019-03-19 ENCOUNTER — Encounter: Payer: Self-pay | Admitting: Oncology

## 2019-03-19 ENCOUNTER — Other Ambulatory Visit: Payer: Self-pay

## 2019-03-19 ENCOUNTER — Inpatient Hospital Stay: Payer: Medicare Other | Attending: Oncology | Admitting: Oncology

## 2019-03-19 DIAGNOSIS — D696 Thrombocytopenia, unspecified: Secondary | ICD-10-CM | POA: Diagnosis not present

## 2019-03-19 DIAGNOSIS — F17210 Nicotine dependence, cigarettes, uncomplicated: Secondary | ICD-10-CM

## 2019-03-19 DIAGNOSIS — E538 Deficiency of other specified B group vitamins: Secondary | ICD-10-CM

## 2019-03-19 DIAGNOSIS — Z79899 Other long term (current) drug therapy: Secondary | ICD-10-CM | POA: Diagnosis not present

## 2019-03-19 NOTE — Progress Notes (Signed)
HEMATOLOGY-ONCOLOGY TeleHEALTH VISIT PROGRESS NOTE  I connected with Meghan Lloyd on 03/19/19 at  1:45 PM EDT by video enabled telemedicine visit and verified that I am speaking with the correct person using two identifiers. I discussed the limitations, risks, security and privacy concerns of performing an evaluation and management service by telemedicine and the availability of in-person appointments. I also discussed with the patient that there may be a patient responsible charge related to this service. The patient expressed understanding and agreed to proceed.   Other persons participating in the visit and their role in the encounter:  Merleen NicelyElizabeth Santos, RN, check in patient.  [facility] Patient's location: Home  Provider's location: work Stage managerChief Complaint: Follow-up for thrombocytopenia.   INTERVAL HISTORY Meghan KettleDorothy Lloyd is a 78 y.o. female who has above history reviewed by me today presents for follow up visit for management of thrombocytopenia Problems and complaints are listed below:  Patient was last seen by me on 09/16/2018.  She has chronic dementia is long-term facility resident. She is pleasantly confused.  History obtained from nursing home nurse Jasmine DecemberSharon. Per nurse, patient has been sleeping a lot, no bleeding events.  Appetite is fair. No other concerns  Review of Systems  Unable to perform ROS: Dementia  Constitutional: Positive for fatigue.    Past Medical History:  Diagnosis Date  . B12 deficiency 06/03/2018  . Dementia ([facility])   . Diabetes mellitus without complication ([facility])   . GERD (gastroesophageal reflux disease)   . Hyperlipidemia   . Hypertension   . Insomnia   . Mood disorder ([facility])   . Orthostatic hypotension   . Persistent atrial fibrillation    a. CHA2DS2VASc = 5-->No OAC in setting of dementia, ? compliance, falls.  . Syncope    a. likely 2/2 orthostasis;  b. 09/2017 Echo: Ef 50-55%, no rwma, mild AI/MR, mod dil LA/RA, mod TR, PASP 38mmHg; c. 09/2017  Carotid U/S: <50% bilat ICA stenosis.   History reviewed. No pertinent surgical history.  Family History  Problem Relation Age of Onset  . Cancer Mother   . Cancer Father     Social History   Socioeconomic History  . Marital status: Widowed    Spouse name: Not on file  . Number of children: Not on file  . Years of education: Not on file  . Highest education level: Not on file  Occupational History  . Not on file  Social Needs  . Financial resource strain: Not on file  . Food insecurity:    Worry: Not on file    Inability: Not on file  . Transportation needs:    Medical: Not on file    Non-medical: Not on file  Tobacco Use  . Smoking status: Current Every Day Smoker    Packs/day: 0.25  . Smokeless tobacco: Never Used  . Tobacco comment: Patietn is at a smoke free facility but states she smokes  Substance and Sexual Activity  . Alcohol use: Yes    Comment: occasionally  . Drug use: No  . Sexual activity: Never  Lifestyle  . Physical activity:    Days per week: Not on file    Minutes per session: Not on file  . Stress: Not on file  Relationships  . Social connections:    Talks on phone: Not on file    Gets together: Not on file    Attends religious service: Not on file    Active member of club or organization: Not on file  Attends meetings of clubs or organizations: Not on file    Relationship status: Not on file  . Intimate partner violence:    Fear of current or ex partner: Not on file    Emotionally abused: Not on file    Physically abused: Not on file    Forced sexual activity: Not on file  Other Topics Concern  . Not on file  Social History Narrative   Lives in [place] with Dtr.    Current Outpatient Medications on File Prior to Visit  Medication Sig Dispense Refill  . acetaminophen (TYLENOL) 325 MG tablet Take 650 mg by mouth every 8 (eight) hours as needed.    . Cholecalciferol (VITAMIN D3) 2000 units TABS Take by mouth.    . cyanocobalamin  (,VITAMIN B-12,) 1000 MCG/ML injection Inject 1 mL (1,000 mcg total) into the muscle every 30 (thirty) days. Beginning once rx for wkly has completed; approx 07/09/18 1 mL 2  . digoxin (LANOXIN) 0.125 MG tablet Take 0.125 mg by mouth daily.    Marland Kitchen donepezil (ARICEPT) 5 MG tablet Take 5 mg by mouth at bedtime.    . famotidine (PEPCID) 20 MG tablet     . magnesium oxide-pyridoxine (BEELITH) 362-20 MG TABS tablet Take 1 tablet by mouth daily.    . metoprolol succinate (TOPROL-XL) 25 MG 24 hr tablet Take 12.5 mg by mouth daily.    . mirtazapine (REMERON) 7.5 MG tablet Take 7.5 mg by mouth at bedtime.    . potassium chloride SA (K-DUR,KLOR-CON) 10 MEQ tablet Take 1 tablet (10 mEq total) by mouth daily.    . pravastatin (PRAVACHOL) 80 MG tablet Take 1 tablet (80 mg total) by mouth daily. 90 tablet 3  . ranitidine (ZANTAC) 150 MG tablet Take 150 mg by mouth 2 (two) times daily.    Marland Kitchen senna (SENOKOT) 8.6 MG TABS tablet Take 1 tablet by mouth daily.    . traZODone (DESYREL) 50 MG tablet Take 25 mg by mouth at bedtime.    . furosemide (LASIX) 40 MG tablet Take 1 tablet (40 mg total) by mouth 2 (two) times daily. 90 tablet 3  . metFORMIN (GLUCOPHAGE) 1000 MG tablet Take 1,000 mg by mouth 2 (two) times daily with a meal.      No current facility-administered medications on file prior to visit.     No Known Allergies     Observations/Objective: There were no vitals filed for this visit. There is no height or weight on file to calculate BMI.  Physical Exam  Constitutional: No distress.  HENT:  Head: Normocephalic and atraumatic.  Pulmonary/Chest: Effort normal.  Psychiatric: Affect normal.    CBC    Component Value Date/Time   WBC 5.0 06/03/2018 1039   RBC 3.91 06/03/2018 1039   HGB 12.8 06/03/2018 1039   HCT 37.6 06/03/2018 1039   PLT 114 (L) 06/03/2018 1039   MCV 96.2 06/03/2018 1039   MCH 32.7 06/03/2018 1039   MCHC 34.0 06/03/2018 1039   RDW 13.7 06/03/2018 1039   LYMPHSABS 1.7  06/03/2018 1039   MONOABS 0.3 06/03/2018 1039   EOSABS 0.1 06/03/2018 1039   BASOSABS 0.0 06/03/2018 1039    CMP     Component Value Date/Time   NA 141 06/03/2018 1039   K 4.2 06/03/2018 1039   CL 107 06/03/2018 1039   CO2 24 06/03/2018 1039   GLUCOSE 141 (H) 06/03/2018 1039   BUN 25 (H) 06/03/2018 1039   CREATININE 1.30 (H) 06/03/2018 1039   CALCIUM  9.1 06/03/2018 1039   PROT 6.8 06/03/2018 1039   ALBUMIN 4.1 06/03/2018 1039   AST 19 06/03/2018 1039   ALT 13 06/03/2018 1039   ALKPHOS 36 (L) 06/03/2018 1039   BILITOT 0.6 06/03/2018 1039   GFRNONAA 38 (L) 06/03/2018 1039   GFRAA 44 (L) 06/03/2018 1039     Assessment and Plan: 1. Thrombocytopenia ([facility])   2. B12 deficiency     Patient has had labs done at the nursing home.  Labs are being faxed to [place]. Labs are reviewed and discussed with patient. 03/18/2019 Wbc 3.4, hemoglobin 13.2, platelet 61,000.  Continue monitor.  B12 deficiency,  03/18/2019 B12 level 492.   Called Lisa on 03/30/2019 and updated patient's condition.  Per lisa, patient is tested positive for COVID 19 currently.  Discussed with daughter and she agrees about not to proceed with further aggressive diagnostic testing for low platelet. Continue observation.  Avoid anti platelet agents or anticoagulation due to bleeding risks.   Follow Up Instructions: 3 months.   I discussed the assessment and treatment plan with the patient. The patient was provided an opportunity to ask questions and all were answered. The patient agreed with the plan and demonstrated an understanding of the instructions.  The patient was advised to call back or seek an in-person evaluation if the symptoms worsen or if the condition fails to improve as anticipated.   I provided  15 minutes of face-to-face video visit time during this encounter, and > 50% was spent counseling as documented under my assessment & plan.  Rickard Patience, MD

## 2019-03-19 NOTE — Progress Notes (Signed)
Contacted patient's Nurse Jasmine December at [facility] for telehealh visit.

## 2019-07-21 ENCOUNTER — Ambulatory Visit: Payer: Medicare Other | Admitting: Oncology

## 2019-07-21 ENCOUNTER — Other Ambulatory Visit: Payer: Medicare Other

## 2019-08-01 ENCOUNTER — Other Ambulatory Visit
Admission: RE | Admit: 2019-08-01 | Discharge: 2019-08-01 | Disposition: A | Discharge status: Home / Self Care | Payer: Medicare Other | Source: Ambulatory Visit | Attending: Family Medicine | Admitting: Family Medicine

## 2019-08-01 DIAGNOSIS — I5022 Chronic systolic (congestive) heart failure: Secondary | ICD-10-CM | POA: Insufficient documentation

## 2019-08-01 LAB — BASIC METABOLIC PANEL
Anion gap: 11 (ref 5–15)
BUN: 32 mg/dL — ABNORMAL HIGH (ref 8–23)
CO2: 28 mmol/L (ref 22–32)
Calcium: 9.1 mg/dL (ref 8.9–10.3)
Chloride: 101 mmol/L (ref 98–111)
Creatinine, Ser: 1.5 mg/dL — ABNORMAL HIGH (ref 0.44–1.00)
GFR calc Af Amer: 38 mL/min — ABNORMAL LOW (ref >60)
GFR calc non Af Amer: 33 mL/min — ABNORMAL LOW (ref >60)
Glucose, Bld: 219 mg/dL — ABNORMAL HIGH (ref 70–99)
Potassium: 4.2 mmol/L (ref 3.5–5.1)
Sodium: 140 mmol/L (ref 135–145)

## 2019-08-01 LAB — MAGNESIUM: Magnesium: 1.9 mg/dL (ref 1.7–2.4)

## 2020-05-24 ENCOUNTER — Emergency Department
Admission: EM | Admit: 2020-05-24 | Discharge: 2020-05-24 | Disposition: A | Discharge status: Home / Self Care | Payer: Medicare Other | Attending: Emergency Medicine | Admitting: Emergency Medicine

## 2020-05-24 ENCOUNTER — Emergency Department: Payer: Medicare Other

## 2020-05-24 DIAGNOSIS — F17200 Nicotine dependence, unspecified, uncomplicated: Secondary | ICD-10-CM | POA: Diagnosis not present

## 2020-05-24 DIAGNOSIS — I5032 Chronic diastolic (congestive) heart failure: Secondary | ICD-10-CM | POA: Insufficient documentation

## 2020-05-24 DIAGNOSIS — Y939 Activity, unspecified: Secondary | ICD-10-CM | POA: Insufficient documentation

## 2020-05-24 DIAGNOSIS — S0101XA Laceration without foreign body of scalp, initial encounter: Secondary | ICD-10-CM | POA: Diagnosis not present

## 2020-05-24 DIAGNOSIS — Y999 Unspecified external cause status: Secondary | ICD-10-CM | POA: Diagnosis not present

## 2020-05-24 DIAGNOSIS — Z79899 Other long term (current) drug therapy: Secondary | ICD-10-CM | POA: Diagnosis not present

## 2020-05-24 DIAGNOSIS — W19XXXA Unspecified fall, initial encounter: Secondary | ICD-10-CM | POA: Diagnosis not present

## 2020-05-24 DIAGNOSIS — S0990XA Unspecified injury of head, initial encounter: Secondary | ICD-10-CM | POA: Diagnosis present

## 2020-05-24 DIAGNOSIS — Z7984 Long term (current) use of oral hypoglycemic drugs: Secondary | ICD-10-CM | POA: Insufficient documentation

## 2020-05-24 DIAGNOSIS — E119 Type 2 diabetes mellitus without complications: Secondary | ICD-10-CM | POA: Insufficient documentation

## 2020-05-24 DIAGNOSIS — I110 Hypertensive heart disease with heart failure: Secondary | ICD-10-CM | POA: Insufficient documentation

## 2020-05-24 DIAGNOSIS — F0390 Unspecified dementia without behavioral disturbance: Secondary | ICD-10-CM | POA: Insufficient documentation

## 2020-05-24 DIAGNOSIS — Y92129 Unspecified place in nursing home as the place of occurrence of the external cause: Secondary | ICD-10-CM | POA: Insufficient documentation

## 2020-05-24 MED ORDER — LIDOCAINE HCL (PF) 1 % IJ SOLN
INTRAMUSCULAR | Status: AC
  Filled 2020-05-24: qty 5

## 2020-05-24 NOTE — ED Provider Notes (Signed)
[name]  ____________________________________________   First MD Initiated Contact with Patient 05/24/20 0022     (approximate)  I have reviewed the triage vital signs and the nursing notes.  History review of system limited secondary to dementia. HISTORY  Chief Complaint Fall   HPI Meghan Lloyd is a 79 y.o. female with below list of previous medical conditions including dementia, atrial fibrillation, hypertension, diabetes, presents to the emergency department following an witnessed fall.  EMS states that the nursing home staff was in the room where the patient fell.       Past Medical History:  Diagnosis Date  . B12 deficiency 06/03/2018  . Dementia (HCC)   . Diabetes mellitus without complication (HCC)   . GERD (gastroesophageal reflux disease)   . Hyperlipidemia   . Hypertension   . Insomnia   . Mood disorder (HCC)   . Orthostatic hypotension   . Persistent atrial fibrillation    a. CHA2DS2VASc = 5-->No OAC in setting of dementia, ? compliance, falls.  . Syncope    a. likely 2/2 orthostasis;  b. 09/2017 Echo: Ef 50-55%, no rwma, mild AI/MR, mod dil LA/RA, mod TR, PASP ; c. 09/2017 Carotid U/S: <50% bilat ICA stenosis.    Patient Active Problem List   Diagnosis Date Noted  . B12 deficiency 06/03/2018  . Lower extremity edema 12/21/2017  . Chronic diastolic CHF (congestive heart failure) (HCC) 12/21/2017  . Atrial fibrillation with RVR (HCC) 10/10/2017  . HTN (hypertension) 10/10/2017  . Diabetes (HCC) 10/10/2017  . Syncope due to orthostatic hypotension 10/04/2017  . Dementia with behavioral disturbance (HCC) 10/01/2017  . Syncope 09/30/2017    No past surgical history on file.  Prior to Admission medications   Medication Sig Start Date End Date Taking? Authorizing Provider  acetaminophen (TYLENOL) 325 MG tablet Take 650 mg by mouth every 8 (eight) hours as needed.    [provider]  Cholecalciferol (VITAMIN D3) 2000 units TABS Take by mouth.    [provider]  cyanocobalamin (,VITAMIN B-12,) 1000 MCG/ML injection Inject 1 mL (1,000 mcg total) into the muscle every 30 (thirty) days. Beginning once rx for wkly has completed; approx 07/09/18 06/17/18   Rickard Patience, MD  digoxin (LANOXIN) 0.125 MG tablet Take 0.125 mg by mouth daily.    [provider]  donepezil (ARICEPT) 5 MG tablet Take 5 mg by mouth at bedtime.    [provider]  famotidine (PEPCID) 20 MG tablet  02/13/19   [provider]  furosemide (LASIX) 40 MG tablet Take 1 tablet (40 mg total) by mouth 2 (two) times daily. 12/21/17 03/21/18  Antonieta Iba, MD  magnesium oxide-pyridoxine (BEELITH) 362-20 MG TABS tablet Take 1 tablet by mouth daily.    [provider]  metFORMIN (GLUCOPHAGE) 1000 MG tablet Take 1,000 mg by mouth 2 (two) times daily with a meal.  11/01/17 11/01/18  [provider]  metoprolol succinate (TOPROL-XL) 25 MG 24 hr tablet Take 12.5 mg by mouth daily.    [provider]  mirtazapine (REMERON) 7.5 MG tablet Take 7.5 mg by mouth at bedtime.    [provider]  potassium chloride SA (K-DUR,KLOR-CON) 10 MEQ tablet Take 1 tablet (10 mEq total) by mouth daily. 05/14/18   Antonieta Iba, MD  pravastatin (PRAVACHOL) 80 MG tablet Take 1 tablet (80 mg total) by mouth daily. 10/31/17   Antonieta Iba, MD  ranitidine (ZANTAC) 150 MG tablet Take 150  mg by mouth 2 (two) times daily.    [provider]  senna (SENOKOT) 8.6 MG TABS tablet Take 1 tablet by mouth daily.    [provider]  traZODone (DESYREL) 50 MG tablet Take 25 mg by mouth at bedtime.    [provider]    Allergies Patient has no known allergies.  Family History  Problem Relation Age of Onset  . Cancer Mother   . Cancer Father     Social History Social History   Tobacco Use  . Smoking status: Current Every Day Smoker    Packs/day:  0.25  . Smokeless tobacco: Never Used  . Tobacco comment: Patietn is at a smoke free facility but states she smokes  Vaping Use  . Vaping Use: Never used  Substance Use Topics  . Alcohol use: Yes    Comment: occasionally  . Drug use: No    Review of Systems Constitutional: No fever/chills Eyes: No visual changes. ENT: No sore throat. Cardiovascular: Denies chest pain. Respiratory: Denies shortness of breath. Gastrointestinal: No abdominal pain.  No nausea, no vomiting.  No diarrhea.  No constipation. Genitourinary: Negative for dysuria. Musculoskeletal: Negative for neck pain.  Negative for back pain. Integumentary: Negative for rash. Neurological: Negative for headaches, focal weakness or numbness.   ____________________________________________   PHYSICAL EXAM:  VITAL SIGNS: ED Triage Vitals  Enc Vitals Group     BP 05/24/20 0007 (!) 143/53     Pulse Rate 05/24/20 0007 65     Resp 05/24/20 0007 12     Temp 05/24/20 0007 98.3 F (36.8 C)     Temp src --      SpO2 05/24/20 0005 100 %     Weight --      Height --      Head Circumference --      Peak Flow --      Pain Score --      Pain Loc --      Pain Edu? --      Excl. in GC? --     Constitutional: Alert and pleasantly confused Eyes: Conjunctivae are normal.  Head: 1 cm linear laceration occipital scalp Ears:  Healthy appearing ear canals and TMs bilaterally Nose: No congestion/rhinnorhea. Mouth/Throat: Patient is wearing a mask. Neck: No stridor.  No meningeal signs.   Cardiovascular: Normal rate, regular rhythm. Good peripheral circulation. Grossly normal heart sounds. Respiratory: Normal respiratory effort.  No retractions. Gastrointestinal: Soft and nontender. No distention.  Musculoskeletal: No lower extremity tenderness nor edema. No gross deformities of extremities. Neurologic:   No gross focal neurologic deficits are appreciated.  Skin:  Skin is warm, dry and intact. Psychiatric: Mood and affect  are normal. Speech and behavior are normal.  ____________________________________________   LABS (all labs ordered are listed, but only abnormal results are displayed)  Labs Reviewed - No data to display ____________________________________________  EKG ED ECG REPORT I, Willard N Franchelle Foskett, the attending physician, personally viewed and interpreted this ECG.   Date: 05/24/2020  EKG Time: 12:08 AM  Rate: 74  Rhythm: Atrial fibrillation  Axis: Normal  Intervals: Irregular RR interval  ST&T Change: Nonspecific ST segment changes consistent with previous EKG from 05/14/2018.   RADIOLOGY I, Haskell N Judye Lorino, personally viewed and evaluated these images (plain radiographs) as part of my medical decision making, as well as reviewing the written report by the radiologist.  ED MD interpretation: Negative CT head and cervical spine per radiologist.  Official radiology report(s): CT Head  Wo Contrast  Result Date: 05/24/2020 CLINICAL DATA:  Unwitnessed fall EXAM: CT HEAD WITHOUT CONTRAST CT CERVICAL SPINE WITHOUT CONTRAST TECHNIQUE: Multidetector CT imaging of the head and cervical spine was performed following the standard protocol without intravenous contrast. Multiplanar CT image reconstructions of the cervical spine were also generated. COMPARISON:  10/10/2017 FINDINGS: CT HEAD FINDINGS Brain: Chronic atrophic changes are noted. No findings to suggest acute hemorrhage, acute infarction or space-occupying mass lesion are seen. Vascular: No hyperdense vessel or unexpected calcification. Skull: Normal. Negative for fracture or focal lesion. Sinuses/Orbits: Chronic opacification of the left maxillary antrum is noted. Other: None. CT CERVICAL SPINE FINDINGS Alignment: Within normal limits. Skull base and vertebrae: 7 cervical segments are well visualized. Vertebral body height is well maintained. Facet hypertrophic changes are noted. No acute fracture or acute facet abnormality is noted. Soft tissues  and spinal canal: Surrounding soft tissue structures demonstrate multiple nodules within the thyroid gland. The largest of these measures 13 mm in left lobe. No soft tissue hematoma is noted. Upper chest: 5 mm nodule is noted right upper lobe. Other: None IMPRESSION: CT of the head: Chronic atrophic changes without acute abnormality. CT of the cervical spine: Mild facet hypertrophic changes are noted without acute abnormality. 5 mm nodule in the right upper lobe. No follow-up needed if patient is low-risk. Non-contrast chest CT can be considered in 12 months if patient is high-risk. This recommendation follows the consensus statement: Guidelines for Management of Incidental Pulmonary Nodules Detected on CT Images: From the Fleischner Society 2017; Radiology 2017; 284:228-243. Scattered small nodules within the thyroid gland as described. No followup recommended (ref: J Am Coll Radiol. 2015 Feb;12(2): 143-50). Electronically Signed   By: Alcide CleverMark  Lukens M.D.   On: 05/24/2020 01:47   CT Cervical Spine Wo Contrast  Result Date: 05/24/2020 CLINICAL DATA:  Unwitnessed fall EXAM: CT HEAD WITHOUT CONTRAST CT CERVICAL SPINE WITHOUT CONTRAST TECHNIQUE: Multidetector CT imaging of the head and cervical spine was performed following the standard protocol without intravenous contrast. Multiplanar CT image reconstructions of the cervical spine were also generated. COMPARISON:  10/10/2017 FINDINGS: CT HEAD FINDINGS Brain: Chronic atrophic changes are noted. No findings to suggest acute hemorrhage, acute infarction or space-occupying mass lesion are seen. Vascular: No hyperdense vessel or unexpected calcification. Skull: Normal. Negative for fracture or focal lesion. Sinuses/Orbits: Chronic opacification of the left maxillary antrum is noted. Other: None. CT CERVICAL SPINE FINDINGS Alignment: Within normal limits. Skull base and vertebrae: 7 cervical segments are well visualized. Vertebral body height is well maintained. Facet  hypertrophic changes are noted. No acute fracture or acute facet abnormality is noted. Soft tissues and spinal canal: Surrounding soft tissue structures demonstrate multiple nodules within the thyroid gland. The largest of these measures 13 mm in left lobe. No soft tissue hematoma is noted. Upper chest: 5 mm nodule is noted right upper lobe. Other: None IMPRESSION: CT of the head: Chronic atrophic changes without acute abnormality. CT of the cervical spine: Mild facet hypertrophic changes are noted without acute abnormality. 5 mm nodule in the right upper lobe. No follow-up needed if patient is low-risk. Non-contrast chest CT can be considered in 12 months if patient is high-risk. This recommendation follows the consensus statement: Guidelines for Management of Incidental Pulmonary Nodules Detected on CT Images: From the Fleischner Society 2017; Radiology 2017; 284:228-243. Scattered small nodules within the thyroid gland as described. No followup recommended (ref: J Am Coll Radiol. 2015 Feb;12(2): 143-50). Electronically Signed   By: Eulah PontMark  Lukens M.D.  On: 05/24/2020 01:47      .Marland KitchenLaceration Repair  Date/Time: 05/24/2020 3:21 AM Performed by: Darci Current, MD Authorized by: Darci Current, MD   Consent:    Consent obtained:  Verbal   Consent given by:  Patient   Risks discussed:  Infection, pain, retained foreign body, poor cosmetic result and poor wound healing Anesthesia (see MAR for exact dosages):    Anesthesia method:  Local infiltration   Local anesthetic:  Lidocaine 1% w/o epi Laceration details:    Location:  Scalp   Scalp location:  Occipital Repair type:    Repair type:  Simple Exploration:    Hemostasis achieved with:  Direct pressure   Wound exploration: entire depth of wound probed and visualized     Contaminated: no   Treatment:    Area cleansed with:  Saline   Amount of cleaning:  Extensive   Irrigation solution:  Sterile saline   Visualized foreign  bodies/material removed: no   Skin repair:    Repair method:  Staples   Number of staples:  1 Approximation:    Approximation:  Close Post-procedure details:    Dressing:  Sterile dressing   Patient tolerance of procedure:  Tolerated well, no immediate complications     ____________________________________________   INITIAL IMPRESSION / MDM / ASSESSMENT AND PLAN / ED COURSE  As part of my medical decision making, I reviewed the following data within the electronic MEDICAL RECORD NUMBER  79 year old female presented with above-stated history and physical exam following fall.  Patient's laceration was repaired without difficulty.  CT scan of the head and cervical spine were performed which were both normal.  ____________________________________________  FINAL CLINICAL IMPRESSION(S) / ED DIAGNOSES  Final diagnoses:  Laceration of scalp, initial encounter  Injury of head, initial encounter     MEDICATIONS GIVEN DURING THIS VISIT:  Medications  lidocaine (PF) (XYLOCAINE) 1 % injection (has no administration in time range)     ED Discharge Orders    None      *Please note:  Meghan Lloyd was evaluated in Emergency Department on 05/24/2020 for the symptoms described in the history of present illness. She was evaluated in the context of the global COVID-19 pandemic, which necessitated consideration that the patient might be at risk for infection with the SARS-CoV-2 virus that causes COVID-19. Institutional protocols and algorithms that pertain to the evaluation of patients at risk for COVID-19 are in a state of rapid change based on information released by regulatory bodies including the CDC and federal and state organizations. These policies and algorithms were followed during the patient's care in the ED.  Some ED evaluations and interventions may be delayed as a result of limited staffing during and after the pandemic.*  Note:  This document was prepared using Dragon voice  recognition software and may include unintentional dictation errors.   Darci Current, MD 05/24/20 [phone]

## 2020-05-24 NOTE — ED Triage Notes (Signed)
As per EMS, pt was an unwitnessed fall. Pt has a history of dementia and baseline is alert but disoriented, as per EMS.

## 2020-05-24 NOTE — ED Notes (Signed)
Bed alarm on.

## 2020-05-24 NOTE — ED Notes (Signed)
Report given to RN at [facility]

## 2020-05-24 NOTE — ED Notes (Signed)
Pt to be transported via [facility].

## 2020-05-24 NOTE — ED Notes (Signed)
Complete triage unable to be completed due to pts dementia.  Pt on cardiac, bp and pulse ox monitor. Both side rails up, and knees in bed bent, to prevent pt sliding or getting out of bed.

## 2020-06-22 ENCOUNTER — Emergency Department
Admission: EM | Admit: 2020-06-22 | Discharge: 2020-06-22 | Disposition: A | Discharge status: Skilled Nursing Facility | Payer: Medicare Other | Attending: Emergency Medicine | Admitting: Emergency Medicine

## 2020-06-22 ENCOUNTER — Emergency Department: Payer: Medicare Other

## 2020-06-22 ENCOUNTER — Other Ambulatory Visit: Payer: Self-pay

## 2020-06-22 ENCOUNTER — Encounter: Payer: Self-pay | Admitting: Emergency Medicine

## 2020-06-22 DIAGNOSIS — F17210 Nicotine dependence, cigarettes, uncomplicated: Secondary | ICD-10-CM | POA: Diagnosis not present

## 2020-06-22 DIAGNOSIS — I10 Essential (primary) hypertension: Secondary | ICD-10-CM | POA: Insufficient documentation

## 2020-06-22 DIAGNOSIS — Y92129 Unspecified place in nursing home as the place of occurrence of the external cause: Secondary | ICD-10-CM | POA: Insufficient documentation

## 2020-06-22 DIAGNOSIS — Z7984 Long term (current) use of oral hypoglycemic drugs: Secondary | ICD-10-CM | POA: Insufficient documentation

## 2020-06-22 DIAGNOSIS — S065XAA Traumatic subdural hemorrhage with loss of consciousness status unknown, initial encounter: Secondary | ICD-10-CM

## 2020-06-22 DIAGNOSIS — R109 Unspecified abdominal pain: Secondary | ICD-10-CM | POA: Diagnosis not present

## 2020-06-22 DIAGNOSIS — Y939 Activity, unspecified: Secondary | ICD-10-CM | POA: Diagnosis not present

## 2020-06-22 DIAGNOSIS — E119 Type 2 diabetes mellitus without complications: Secondary | ICD-10-CM | POA: Insufficient documentation

## 2020-06-22 DIAGNOSIS — W19XXXA Unspecified fall, initial encounter: Secondary | ICD-10-CM | POA: Diagnosis not present

## 2020-06-22 DIAGNOSIS — S0990XA Unspecified injury of head, initial encounter: Secondary | ICD-10-CM | POA: Diagnosis present

## 2020-06-22 DIAGNOSIS — I5032 Chronic diastolic (congestive) heart failure: Secondary | ICD-10-CM | POA: Diagnosis not present

## 2020-06-22 DIAGNOSIS — Y999 Unspecified external cause status: Secondary | ICD-10-CM | POA: Insufficient documentation

## 2020-06-22 DIAGNOSIS — S065X0A Traumatic subdural hemorrhage without loss of consciousness, initial encounter: Secondary | ICD-10-CM | POA: Diagnosis not present

## 2020-06-22 DIAGNOSIS — Z79899 Other long term (current) drug therapy: Secondary | ICD-10-CM | POA: Insufficient documentation

## 2020-06-22 DIAGNOSIS — Z20822 Contact with and (suspected) exposure to covid-19: Secondary | ICD-10-CM | POA: Insufficient documentation

## 2020-06-22 DIAGNOSIS — F0390 Unspecified dementia without behavioral disturbance: Secondary | ICD-10-CM | POA: Insufficient documentation

## 2020-06-22 LAB — CBC WITH DIFFERENTIAL/PLATELET
Abs Immature Granulocytes: 0.03 10*3/uL (ref 0.00–0.07)
Basophils Absolute: 0 10*3/uL (ref 0.0–0.1)
Basophils Relative: 0 %
Eosinophils Absolute: 0 10*3/uL (ref 0.0–0.5)
Eosinophils Relative: 1 %
HCT: 36.5 % (ref 36.0–46.0)
Hemoglobin: 12.4 g/dL (ref 12.0–15.0)
Immature Granulocytes: 1 %
Lymphocytes Relative: 19 %
Lymphs Abs: 1.1 10*3/uL (ref 0.7–4.0)
MCH: 32 pg (ref 26.0–34.0)
MCHC: 34 g/dL (ref 30.0–36.0)
MCV: 94.3 fL (ref 80.0–100.0)
Monocytes Absolute: 0.5 10*3/uL (ref 0.1–1.0)
Monocytes Relative: 8 %
Neutro Abs: 4.4 10*3/uL (ref 1.7–7.7)
Neutrophils Relative %: 71 %
Platelets: 95 10*3/uL — ABNORMAL LOW (ref 150–400)
RBC: 3.87 MIL/uL (ref 3.87–5.11)
RDW: 13 % (ref 11.5–15.5)
WBC: 6.1 10*3/uL (ref 4.0–10.5)
nRBC: 0 % (ref 0.0–0.2)

## 2020-06-22 LAB — COMPREHENSIVE METABOLIC PANEL
ALT: 13 U/L (ref 0–44)
AST: 21 U/L (ref 15–41)
Albumin: 4.3 g/dL (ref 3.5–5.0)
Alkaline Phosphatase: 56 U/L (ref 38–126)
Anion gap: 13 (ref 5–15)
BUN: 33 mg/dL — ABNORMAL HIGH (ref 8–23)
CO2: 26 mmol/L (ref 22–32)
Calcium: 9.2 mg/dL (ref 8.9–10.3)
Chloride: 103 mmol/L (ref 98–111)
Creatinine, Ser: 1.63 mg/dL — ABNORMAL HIGH (ref 0.44–1.00)
GFR calc Af Amer: 34 mL/min — ABNORMAL LOW (ref >60)
GFR calc non Af Amer: 29 mL/min — ABNORMAL LOW (ref >60)
Glucose, Bld: 137 mg/dL — ABNORMAL HIGH (ref 70–99)
Potassium: 4.9 mmol/L (ref 3.5–5.1)
Sodium: 142 mmol/L (ref 135–145)
Total Bilirubin: 1.1 mg/dL (ref 0.3–1.2)
Total Protein: 6.5 g/dL (ref 6.5–8.1)

## 2020-06-22 LAB — URINALYSIS, COMPLETE (UACMP) WITH MICROSCOPIC
Bacteria, UA: NONE SEEN
Bilirubin Urine: NEGATIVE
Glucose, UA: NEGATIVE mg/dL
Hgb urine dipstick: NEGATIVE
Ketones, ur: NEGATIVE mg/dL
Leukocytes,Ua: NEGATIVE
Nitrite: NEGATIVE
Protein, ur: NEGATIVE mg/dL
Specific Gravity, Urine: 1.01 (ref 1.005–1.030)
pH: 5 (ref 5.0–8.0)

## 2020-06-22 LAB — SARS CORONAVIRUS 2 BY RT PCR (HOSPITAL ORDER, PERFORMED IN ~~LOC~~ HOSPITAL LAB): SARS Coronavirus 2: NEGATIVE

## 2020-06-22 LAB — LIPASE, BLOOD: Lipase: 25 U/L (ref 11–51)

## 2020-06-22 LAB — CK: Total CK: 84 U/L (ref 38–234)

## 2020-06-22 LAB — TROPONIN I (HIGH SENSITIVITY): Troponin I (High Sensitivity): 11 ng/L (ref <18)

## 2020-06-22 MED ORDER — ONDANSETRON HCL 4 MG/2ML IJ SOLN
4.0000 mg | Freq: Once | INTRAMUSCULAR | Status: AC
  Administered 2020-06-22: 4 mg via INTRAVENOUS
  Filled 2020-06-22: qty 2

## 2020-06-22 MED ORDER — MORPHINE SULFATE (PF) 2 MG/ML IV SOLN
2.0000 mg | Freq: Once | INTRAVENOUS | Status: AC
  Administered 2020-06-22: 2 mg via INTRAVENOUS
  Filled 2020-06-22: qty 1

## 2020-06-22 NOTE — Consult Note (Signed)
Neurosurgery-New Consultation Evaluation 06/22/2020 Meghan Lloyd [id number]  Identifying Statement: Meghan Lloyd is a 79 y.o. female from [place] [address] with recent fall  Physician Requesting Consultation: Dorothea Glassman  History of Present Illness:  Meghan Lloyd is here after reported fall.  She does have a history of dementia and per her daughter-in-law, the nursing facility states that she has been having insomnia and not getting adequate sleep.  On initial presentation to the emergency department, she appeared to be at her neurologic baseline but a CT of the head was obtained which revealed a small parafalcine subdural hematoma.  She is not on any blood thinner or antiplatelet medication.  Past Medical History:  Past Medical History:  Diagnosis Date  . B12 deficiency 06/03/2018  . Dementia (HCC)   . Diabetes mellitus without complication (HCC)   . GERD (gastroesophageal reflux disease)   . Hyperlipidemia   . Hypertension   . Insomnia   . Mood disorder (HCC)   . Orthostatic hypotension   . Persistent atrial fibrillation (HCC)    a. CHA2DS2VASc = 5-->No OAC in setting of dementia, ? compliance, falls.  . Syncope    a. likely 2/2 orthostasis;  b. 09/2017 Echo: Ef 50-55%, no rwma, mild AI/MR, mod dil LA/RA, mod TR, PASP ; c. 09/2017 Carotid U/S: <50% bilat ICA stenosis.    Social History: Social History   Socioeconomic History  . Marital status: Widowed    Spouse name: Not on file  . Number of children: Not on file  . Years of education: Not on file  . Highest education level: Not on file  Occupational History  . Not on file  Tobacco Use  . Smoking status: Current Every Day Smoker    Packs/day: 0.25  . Smokeless tobacco: Never Used  . Tobacco comment: Patietn is at a smoke free facility but states she smokes  Vaping Use  . Vaping Use: Never used  Substance and Sexual Activity  . Alcohol use: Yes    Comment: occasionally  . Drug use: No  . Sexual activity: Never   Other Topics Concern  . Not on file  Social History Narrative   Lives in [place] with Dtr.   Social Determinants of Health   Financial Resource Strain:   . Difficulty of Paying Living Expenses: Not on file  Food Insecurity:   . Worried About Programme researcher, broadcasting/film/video in the Last Year: Not on file  . Ran Out of Food in the Last Year: Not on file  Transportation Needs:   . Lack of Transportation (Medical): Not on file  . Lack of Transportation (Non-Medical): Not on file  Physical Activity:   . Days of Exercise per Week: Not on file  . Minutes of Exercise per Session: Not on file  Stress:   . Feeling of Stress : Not on file  Social Connections:   . Frequency of Communication with Friends and Family: Not on file  . Frequency of Social Gatherings with Friends and Family: Not on file  . Attends Religious Services: Not on file  . Active Member of Clubs or Organizations: Not on file  . Attends Banker Meetings: Not on file  . Marital Status: Not on file  Intimate Partner Violence:   . Fear of Current or Ex-Partner: Not on file  . Emotionally Abused: Not on file  . Physically Abused: Not on file  . Sexually Abused: Not on file    Family History: Family History  Problem Relation Age of  Onset  . Cancer Mother   . Cancer Father     Review of Systems:  Review of Systems - General ROS: Negative Psychological ROS: Negative Ophthalmic ROS: Negative ENT ROS: Negative Hematological and Lymphatic ROS: Negative  Endocrine ROS: Negative Respiratory ROS: Negative Cardiovascular ROS: Negative Gastrointestinal ROS: Negative Genito-Urinary ROS: Negative Musculoskeletal ROS: Negative Neurological ROS: Negative for headache  Dermatological ROS: Negative  Physical Exam: BP (!) 135/51   Pulse 67   Temp 98.2 F (36.8 C) (Oral)   Resp 12   Ht 5\' 7"  (1.702 m)   Wt 68.5 kg   SpO2 100%   BMI 23.65 kg/m  Body mass index is 23.65 kg/m. Body surface area is 1.8 meters  squared. General appearance: Asleep resting in bed Head: Normocephalic, atraumatic  Laboratory: Results for orders placed or performed during the hospital encounter of 06/22/20  SARS Coronavirus 2 by RT PCR (hospital order, performed in [facility] hospital lab) Nasopharyngeal Nasopharyngeal Swab   Specimen: Nasopharyngeal Swab  Result Value Ref Range   SARS Coronavirus 2 NEGATIVE NEGATIVE  Comprehensive metabolic panel  Result Value Ref Range   Sodium 142 135 - 145 mmol/L   Potassium 4.9 3.5 - 5.1 mmol/L   Chloride 103 98 - 111 mmol/L   CO2 26 22 - 32 mmol/L   Glucose, Bld 137 (H) 70 - 99 mg/dL   BUN 33 (H) 8 - 23 mg/dL   Creatinine, Ser [facility] (H) 0.44 - 1.00 mg/dL   Calcium 9.2 8.9 - 2.95 mg/dL   Total Protein 6.5 6.5 - 8.1 g/dL   Albumin 4.3 3.5 - 5.0 g/dL   AST 21 15 - 41 U/L   ALT 13 0 - 44 U/L   Alkaline Phosphatase 56 38 - 126 U/L   Total Bilirubin 1.1 0.3 - 1.2 mg/dL   GFR calc non Af Amer 29 (L) >60 mL/min   GFR calc Af Amer 34 (L) >60 mL/min   Anion gap 13 5 - 15  Lipase, blood  Result Value Ref Range   Lipase 25 11 - 51 U/L  CBC with Differential  Result Value Ref Range   WBC 6.1 4.0 - 10.5 K/uL   RBC 3.87 3.87 - 5.11 MIL/uL   Hemoglobin 12.4 12.0 - 15.0 g/dL   HCT 18.8 36 - 46 %   MCV 94.3 80.0 - 100.0 fL   MCH 32.0 26.0 - 34.0 pg   MCHC 34.0 30.0 - 36.0 g/dL   RDW 41.6 60.6 - 30.1 %   Platelets 95 (L) 150 - 400 K/uL   nRBC 0.0 0.0 - 0.2 %   Neutrophils Relative % 71 %   Neutro Abs 4.4 1.7 - 7.7 K/uL   Lymphocytes Relative 19 %   Lymphs Abs 1.1 0.7 - 4.0 K/uL   Monocytes Relative 8 %   Monocytes Absolute 0.5 0 - 1 K/uL   Eosinophils Relative 1 %   Eosinophils Absolute 0.0 0 - 0 K/uL   Basophils Relative 0 %   Basophils Absolute 0.0 0 - 0 K/uL   Immature Granulocytes 1 %   Abs Immature Granulocytes 0.03 0.00 - 0.07 K/uL  Urinalysis, Complete w Microscopic Urine  Result Value Ref Range   Color, Urine YELLOW (A) YELLOW   APPearance CLEAR (A) CLEAR    Specific Gravity, Urine 1.010 1.005 - 1.030   pH 5.0 5.0 - 8.0   Glucose, UA NEGATIVE NEGATIVE mg/dL   Hgb urine dipstick NEGATIVE NEGATIVE   Bilirubin Urine NEGATIVE NEGATIVE  Ketones, ur NEGATIVE NEGATIVE mg/dL   Protein, ur NEGATIVE NEGATIVE mg/dL   Nitrite NEGATIVE NEGATIVE   Leukocytes,Ua NEGATIVE NEGATIVE   WBC, UA 0-5 0 - 5 WBC/hpf   Bacteria, UA NONE SEEN NONE SEEN   Squamous Epithelial / LPF 0-5 0 - 5   Hyaline Casts, UA PRESENT   CK  Result Value Ref Range   Total CK 84 38.0 - 234.0 U/L  Troponin I (High Sensitivity)  Result Value Ref Range   Troponin I (High Sensitivity) 11 <18 ng/L   I personally reviewed labs  Imaging: CT head:1. Small right para falcine subdural hematoma is noted anteriorly.No mass effect or midline shift is noted. Very small right posteriorscalp hematoma is noted. 2. Mild diffuse cortical atrophy is noted. Mild chronic ischemicwhite matter disease. No acute abnormality seen in the cervical spine.  Impression/Plan:  Ms. Leys is here for evaluation after a fall and found to have a very small subdural hematoma.  She is on no antiplatelet or anticoagulant therapy and is at low risk for propagation.  Do recommend repeat CT scan in 6 hours and if this is stable, patient is clear for discharge at that time.   1.  Diagnosis: Small parafalcine subdural hematoma  2.  Plan -Repeat CT head in 6 hours

## 2020-06-22 NOTE — ED Notes (Signed)
Called [facility] for transport to [facility]  2025

## 2020-06-22 NOTE — Discharge Instructions (Signed)
Please continue your normal care at home.  The CT scan of your head shows a very small bruising around the brain or a tiny spot of bleeding around the brain.  After observing you for 6 hours and repeating that scan, this small spot is no different and has not enlarged.  Please avoid aspirin, Motrin, ibuprofen, Advil, Aleve and similar NSAID medications over the next 2 weeks.  These medications thin your blood to a small degree and may contribute to worsening bleeding around your brain if you take them.  For pain, I would recommend using Tylenol.  Use up to 1000 mg at a time, up to four times per day.  Do not take more than 4000 mg of Tylenol within 24 hours.  Please follow-up with your PCP within the next 3-5 days to discuss this visit and your continued care at home.  If you develop any strokelike symptoms or severely worsening headache, please return to the ED.

## 2020-06-22 NOTE — ED Notes (Signed)
Daughter in law at bedside

## 2020-06-22 NOTE — ED Notes (Signed)
Mittens placed on patient's hands and kling applied to protect IV site. Patient became agitated when changed into gown and struck this writer and bent back thumb and index finger of this writer's left hand. Patient becomes calm and drowsy when left alone. Tech at bedside during IV insertion and mittens being applied.

## 2020-06-22 NOTE — ED Triage Notes (Signed)
Pt reports was getting clothes out of closet this am and fell hitting the back of her head. Pt with hematoma to back of head.

## 2020-06-22 NOTE — ED Notes (Signed)
Ems came to pick up pt, got pt ready for d/c and EMS got called away before transporting pt so pt will remain in er at this time awaiting transport

## 2020-06-22 NOTE — ED Notes (Signed)
Pt assisted to the bathroom. Pt incontinent of urine. Pt brief changed. Pt wheeled to the lobby and placed beside of first RN desk.

## 2020-06-22 NOTE — ED Triage Notes (Signed)
Pt in via EMS from [facility] for fall. Pt has a hematoma to back of head, no LOC. Pt with hx of dementia.

## 2020-06-22 NOTE — ED Provider Notes (Signed)
[facility] Emergency Department Provider Note   ____________________________________________   First MD Initiated Contact with Patient 06/22/20 1031     (approximate)  I have reviewed the triage vital signs and the nursing notes.   HISTORY  Chief Complaint Fall and Head Injury  History limited by dementia HPI Meria Crilly is a 79 y.o. female who fell at [facility].  Patient has dementia and does not remember it.  Patient does have a hematoma on the back of her head.  There is no laceration we can find.  Patient now is complaining of abdominal pain.  Was tender to palpation and patient is holding her belly and rubbing it.  She is not vomiting.  She is not having a fever or any other complaints currently.  Patient cannot tell me much about the belly pain.         Past Medical History:  Diagnosis Date  . B12 deficiency 06/03/2018  . Dementia (HCC)   . Diabetes mellitus without complication (HCC)   . GERD (gastroesophageal reflux disease)   . Hyperlipidemia   . Hypertension   . Insomnia   . Mood disorder (HCC)   . Orthostatic hypotension   . Persistent atrial fibrillation (HCC)    a. CHA2DS2VASc = 5-->No OAC in setting of dementia, ? compliance, falls.  . Syncope    a. likely 2/2 orthostasis;  b. 09/2017 Echo: Ef 50-55%, no rwma, mild AI/MR, mod dil LA/RA, mod TR, PASP ; c. 09/2017 Carotid U/S: <50% bilat ICA stenosis.    Patient Active Problem List   Diagnosis Date Noted  . B12 deficiency 06/03/2018  . Lower extremity edema 12/21/2017  . Chronic diastolic CHF (congestive heart failure) (HCC) 12/21/2017  . Atrial fibrillation with RVR (HCC) 10/10/2017  . HTN (hypertension) 10/10/2017  . Diabetes (HCC) 10/10/2017  . Syncope due to orthostatic hypotension 10/04/2017  . Dementia with behavioral disturbance (HCC) 10/01/2017  . Syncope 09/30/2017    History reviewed. No pertinent surgical history.  Prior to Admission medications     Medication Sig Start Date End Date Taking? Authorizing Provider  acetaminophen (TYLENOL) 325 MG tablet Take 650 mg by mouth every 8 (eight) hours as needed.    [provider]  Cholecalciferol (VITAMIN D3) 2000 units TABS Take by mouth.    [provider]  cyanocobalamin (,VITAMIN B-12,) 1000 MCG/ML injection Inject 1 mL (1,000 mcg total) into the muscle every 30 (thirty) days. Beginning once rx for wkly has completed; approx 07/09/18 06/17/18   Rickard Patience, MD  digoxin (LANOXIN) 0.125 MG tablet Take 0.125 mg by mouth daily.    [provider]  donepezil (ARICEPT) 5 MG tablet Take 5 mg by mouth at bedtime.    [provider]  famotidine (PEPCID) 20 MG tablet  02/13/19   [provider]  furosemide (LASIX) 40 MG tablet Take 1 tablet (40 mg total) by mouth 2 (two) times daily. 12/21/17 03/21/18  Antonieta Iba, MD  magnesium oxide-pyridoxine (BEELITH) 362-20 MG TABS tablet Take 1 tablet by mouth daily.    [provider]  metFORMIN (GLUCOPHAGE) 1000 MG tablet Take 1,000 mg by mouth 2 (two) times daily with a meal.  11/01/17 11/01/18  [provider]  metoprolol succinate (TOPROL-XL) 25 MG 24 hr tablet Take 12.5 mg by mouth daily.    [provider]  mirtazapine (REMERON) 7.5 MG tablet Take 7.5 mg by mouth at bedtime.    [provider]  potassium chloride SA (  K-DUR,KLOR-CON) 10 MEQ tablet Take 1 tablet (10 mEq total) by mouth daily. 05/14/18   Antonieta Iba, MD  pravastatin (PRAVACHOL) 80 MG tablet Take 1 tablet (80 mg total) by mouth daily. 10/31/17   Antonieta Iba, MD  ranitidine (ZANTAC) 150 MG tablet Take 150 mg by mouth 2 (two) times daily.    [provider]  senna (SENOKOT) 8.6 MG TABS tablet Take 1 tablet by mouth daily.    [provider]  traZODone (DESYREL) 50 MG tablet Take 25 mg by mouth at bedtime.    [provider]    Allergies Patient has no known allergies.  Family History   Problem Relation Age of Onset  . Cancer Mother   . Cancer Father     Social History Social History   Tobacco Use  . Smoking status: Current Every Day Smoker    Packs/day: 0.25  . Smokeless tobacco: Never Used  . Tobacco comment: Patietn is at a smoke free facility but states she smokes  Vaping Use  . Vaping Use: Never used  Substance Use Topics  . Alcohol use: Yes    Comment: occasionally  . Drug use: No    Review of Systems Somewhat difficult to obtain in this demented patient however Constitutional: No fever/chills Eyes: No visual changes. ENT: No sore throat. Cardiovascular: Denies chest pain. Respiratory: Denies shortness of breath. Gastrointestinal:  abdominal pain.  No nausea, no vomiting.  No diarrhea.  No constipation. Genitourinary: Negative for dysuria. Musculoskeletal: Negative for back pain. Skin: Negative for rash. Neurological: Negative for focal weakness   ____________________________________________   PHYSICAL EXAM:  VITAL SIGNS: ED Triage Vitals  Enc Vitals Group     BP 06/22/20 0941 113/61     Pulse Rate 06/22/20 0941 88     Resp 06/22/20 0941 16     Temp 06/22/20 0941 98.2 F (36.8 C)     Temp Source 06/22/20 0941 Oral     SpO2 06/22/20 0941 99 %     Weight 06/22/20 0943 151 lb (68.5 kg)     Height 06/22/20 0943 5\' 7"  (1.702 m)     Head Circumference --      Peak Flow --      Pain Score --      Pain Loc --      Pain Edu? --      Excl. in GC? --     Constitutional: Alert and oriented to person.  Rubbing her belly and saying it hurts Eyes: Conjunctivae are normal. PER. EOMI. Head: Atraumatic. Nose: No congestion/rhinnorhea. Mouth/Throat: Mucous membranes are moist.  Oropharynx non-erythematous. Neck: No stridor.  No cervical spine tenderness to palpation. Cardiovascular: Normal rate, regular rhythm. Grossly normal heart sounds.  Good peripheral circulation. Respiratory: Normal respiratory effort.  No retractions. Lungs  CTAB. Gastrointestinal: Soft and moderately diffusely tender no focal tenderness. No distention. No abdominal bruits.  Musculoskeletal: No upper or lower lower extremity tenderness nor edema.  No chest tenderness no apparent back tenderness Neurologic:  Normal speech and language. No gross focal neurologic deficits are appreciated. . Skin:  Skin is warm, dry and intact. No rash noted.   ____________________________________________   LABS (all labs ordered are listed, but only abnormal results are displayed)  Labs Reviewed  COMPREHENSIVE METABOLIC PANEL - Abnormal; Notable for the following components:      Result Value   Glucose, Bld 137 (*)    BUN 33 (*)    Creatinine, Ser 1.63 (*)  GFR calc non Af Amer 29 (*)    GFR calc Af Amer 34 (*)    All other components within normal limits  CBC WITH DIFFERENTIAL/PLATELET - Abnormal; Notable for the following components:   Platelets 95 (*)    All other components within normal limits  URINALYSIS, COMPLETE (UACMP) WITH MICROSCOPIC - Abnormal; Notable for the following components:   Color, Urine YELLOW (*)    APPearance CLEAR (*)    All other components within normal limits  SARS CORONAVIRUS 2 BY RT PCR (HOSPITAL ORDER, PERFORMED IN [facility])  LIPASE, BLOOD  CK  TROPONIN I (HIGH SENSITIVITY)  TROPONIN I (HIGH SENSITIVITY)   ____________________________________________  EKG  EKG read interpreted by me shows A. fib at a rate of 76 normal axis no significant change in EKG since July 26 of this year ____________________________________________  RADIOLOGY  ED MD interpretation: CT of the head and neck read by radiology shows no acute changes except for a small parafalcine subdural with no mass-effect.  Official radiology report(s): CT ABDOMEN PELVIS WO CONTRAST  Result Date: 06/22/2020 CLINICAL DATA:  Abdominal pain.  Acute abdominal pain EXAM: CT ABDOMEN AND PELVIS WITHOUT CONTRAST TECHNIQUE: Multidetector CT  imaging of the abdomen and pelvis was performed following the standard protocol without IV contrast. COMPARISON:  None. FINDINGS: Lower chest: Lung bases are clear. Hepatobiliary: No focal hepatic lesion. Postcholecystectomy. No biliary dilatation. Pancreas: Pancreas is normal. No ductal dilatation. No pancreatic inflammation. Spleen: Normal spleen Adrenals/urinary tract: Cyst or adenoma of the LEFT adrenal gland measures 2.3 cm. Bilateral small nonobstructing renal calculi. No ureterolithiasis or obstructive uropathy. No bladder calculi. Stomach/Bowel: Stomach, small bowel, appendix, and cecum are normal. The colon and rectosigmoid colon are normal. Vascular/Lymphatic: Abdominal aorta is normal caliber with atherosclerotic calcification. There is no retroperitoneal or periportal lymphadenopathy. No pelvic lymphadenopathy. Reproductive: Post hysterectomy anatomy Other: No free fluid. Musculoskeletal: No aggressive osseous lesion. Degenerative change of the hips. IMPRESSION: 1. No acute findings abdomen pelvis. 2. Bilateral nephrolithiasis without ureterolithiasis or obstructive uropathy. 3. Postcholecystectomy. 4. Benign LEFT adrenal lesion. Electronically Signed   By: Genevive BiStewart  Edmunds M.D.   On: 06/22/2020 14:47   CT Head Wo Contrast  Result Date: 06/22/2020 CLINICAL DATA:  Head injury after fall today. EXAM: CT HEAD WITHOUT CONTRAST CT CERVICAL SPINE WITHOUT CONTRAST TECHNIQUE: Multidetector CT imaging of the head and cervical spine was performed following the standard protocol without intravenous contrast. Multiplanar CT image reconstructions of the cervical spine were also generated. COMPARISON:  May 24, 2020. FINDINGS: CT HEAD FINDINGS Brain: Mild diffuse cortical atrophy is noted. Mild chronic ischemic white matter disease is noted. Small right para falcine subdural hematoma is noted anteriorly. No mass effect or midline shift is noted. Ventricular size is within normal limits. There is no evidence of  acute infarction or mass lesion. Vascular: No hyperdense vessel or unexpected calcification. Skull: Normal. Negative for fracture or focal lesion. Sinuses/Orbits: Stable opacification of left maxillary sinus is noted. Other: Very small right posterior scalp hematoma is noted. CT CERVICAL SPINE FINDINGS Alignment: Normal. Skull base and vertebrae: No acute fracture. No primary bone lesion or focal pathologic process. Soft tissues and spinal canal: No prevertebral fluid or swelling. No visible canal hematoma. Disc levels:  Disc spaces are unremarkable. Upper chest: Stable nodule is seen in right lung apex which may simply represent scarring. Other: Mild degenerative changes are seen involving posterior facet joints bilaterally. IMPRESSION: 1. Small right para falcine subdural hematoma is noted anteriorly. No mass  effect or midline shift is noted. Very small right posterior scalp hematoma is noted. Critical Value/emergent results were called by telephone at the time of interpretation on 06/22/2020 at 10:40 am to provider [facility] , who verbally acknowledged these results. 2. Mild diffuse cortical atrophy is noted. Mild chronic ischemic white matter disease. No acute abnormality seen in the cervical spine. 3. As noted on prior exam, small right apical nodular density is noted which measures 7 mm. This may simply represent scarring, but follow-up CT scan in 6-12 months is recommended to ensure stability. Electronically Signed   By: Lupita Raider M.D.   On: 06/22/2020 10:41   CT Cervical Spine Wo Contrast  Result Date: 06/22/2020 CLINICAL DATA:  Head injury after fall today. EXAM: CT HEAD WITHOUT CONTRAST CT CERVICAL SPINE WITHOUT CONTRAST TECHNIQUE: Multidetector CT imaging of the head and cervical spine was performed following the standard protocol without intravenous contrast. Multiplanar CT image reconstructions of the cervical spine were also generated. COMPARISON:  May 24, 2020. FINDINGS: CT HEAD FINDINGS  Brain: Mild diffuse cortical atrophy is noted. Mild chronic ischemic white matter disease is noted. Small right para falcine subdural hematoma is noted anteriorly. No mass effect or midline shift is noted. Ventricular size is within normal limits. There is no evidence of acute infarction or mass lesion. Vascular: No hyperdense vessel or unexpected calcification. Skull: Normal. Negative for fracture or focal lesion. Sinuses/Orbits: Stable opacification of left maxillary sinus is noted. Other: Very small right posterior scalp hematoma is noted. CT CERVICAL SPINE FINDINGS Alignment: Normal. Skull base and vertebrae: No acute fracture. No primary bone lesion or focal pathologic process. Soft tissues and spinal canal: No prevertebral fluid or swelling. No visible canal hematoma. Disc levels:  Disc spaces are unremarkable. Upper chest: Stable nodule is seen in right lung apex which may simply represent scarring. Other: Mild degenerative changes are seen involving posterior facet joints bilaterally. IMPRESSION: 1. Small right para falcine subdural hematoma is noted anteriorly. No mass effect or midline shift is noted. Very small right posterior scalp hematoma is noted. Critical Value/emergent results were called by telephone at the time of interpretation on 06/22/2020 at 10:40 am to provider [facility] , who verbally acknowledged these results. 2. Mild diffuse cortical atrophy is noted. Mild chronic ischemic white matter disease. No acute abnormality seen in the cervical spine. 3. As noted on prior exam, small right apical nodular density is noted which measures 7 mm. This may simply represent scarring, but follow-up CT scan in 6-12 months is recommended to ensure stability. Electronically Signed   By: Lupita Raider M.D.   On: 06/22/2020 10:41    ____________________________________________   PROCEDURES  Procedure(s) performed (including Critical  Care):  Procedures   ____________________________________________   INITIAL IMPRESSION / ASSESSMENT AND PLAN / ED COURSE  We will CT this lady's belly since it is also tender.  And we are attempting to contact neurosurgery.  Anticipate likely will reCT her in 6 hours and see if any change happens.    ----------------------------------------- 11:36 AM on 06/22/2020 -----------------------------------------  Discussed with Dr. Adriana Simas neurosurgery.  We will and the CT her again in 6 hours and if stable she should be fine for discharge.  We will also find out what is going on with the abdominal CT.   ----------------------------------------- 3:05 PM on 06/22/2020 -----------------------------------------  CT of the abdomen read as negative by radiology.  I reviewed the films.  We will wait to do the repeat CT of  the head at 5 PM.  I will sign this out to the oncoming physician       ____________________________________________   FINAL CLINICAL IMPRESSION(S) / ED DIAGNOSES  Final diagnoses:  Fall, initial encounter  Subdural hematoma [facility])     ED Discharge Orders    None       Note:  This document was prepared using Dragon voice recognition software and may include unintentional dictation errors.    Arnaldo Natal, MD 06/22/20 [phone]

## 2020-06-22 NOTE — ED Notes (Signed)
Attempted to call report. Message left without identifying the patient.

## 2020-12-10 ENCOUNTER — Telehealth: Payer: Self-pay | Admitting: Cardiovascular Disease

## 2020-12-10 NOTE — Telephone Encounter (Signed)
3 attempts to schedule fu appt from recall list.   Deleting recall.   

## 2020-12-28 DEATH — deceased

## 2023-10-29 ENCOUNTER — Encounter: Payer: Self-pay | Admitting: Oncology
